# Patient Record
Sex: Male | Born: 1980 | Race: Black or African American | Hispanic: No | Marital: Married | Smoking: Never smoker
Health system: Southern US, Community
[De-identification: ages and names within clinical notes are randomized; demographics above are authoritative.]

## PROBLEM LIST (undated history)

## (undated) DIAGNOSIS — U071 COVID-19: Secondary | ICD-10-CM

## (undated) DIAGNOSIS — T7840XA Allergy, unspecified, initial encounter: Secondary | ICD-10-CM

## (undated) DIAGNOSIS — G4733 Obstructive sleep apnea (adult) (pediatric): Secondary | ICD-10-CM

## (undated) DIAGNOSIS — K219 Gastro-esophageal reflux disease without esophagitis: Secondary | ICD-10-CM

## (undated) HISTORY — DX: Obstructive sleep apnea (adult) (pediatric): G47.33

## (undated) HISTORY — DX: COVID-19: U07.1

## (undated) HISTORY — DX: Gastro-esophageal reflux disease without esophagitis: K21.9

## (undated) HISTORY — DX: Allergy, unspecified, initial encounter: T78.40XA

---
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
## 2013-02-14 HISTORY — PX: VASECTOMY: SHX75

## 2014-11-10 ENCOUNTER — Encounter: Payer: Self-pay | Admitting: Family Medicine

## 2014-11-10 ENCOUNTER — Ambulatory Visit (INDEPENDENT_AMBULATORY_CARE_PROVIDER_SITE_OTHER): Payer: 59 | Admitting: Family Medicine

## 2014-11-10 VITALS — BP 102/63 | HR 70 | Temp 97.6°F | Ht 68.0 in | Wt 207.0 lb

## 2014-11-10 DIAGNOSIS — E669 Obesity, unspecified: Secondary | ICD-10-CM | POA: Diagnosis not present

## 2014-11-10 DIAGNOSIS — Z0000 Encounter for general adult medical examination without abnormal findings: Secondary | ICD-10-CM

## 2014-11-10 NOTE — Patient Instructions (Addendum)
We'll contact you about the labs drawn today Keep up the great job with your healthy eating efforts and weight loss Check out the information at familydoctor.org entitled "What It Takes to Lose Weight" Try to lose between 1-2 pounds per week by taking in fewer calories and burning off more calories until you reach your target BMI You can succeed by limiting portions, limiting foods dense in calories and fat, becoming more active, and drinking 8 glasses of water a day Don't skip meals, especially breakfast, as skipping meals may alter your metabolism Do not use over-the-counter weight loss pills or gimmicks that claim rapid weight loss A healthy BMI (or body mass index) is between 18.5 and 24.9 You can calculate your ideal BMI at the NIH website [employer]  Health Maintenance A healthy lifestyle and preventative care can promote health and wellness.  Maintain regular health, dental, and eye exams.  Eat a healthy diet. Foods like vegetables, fruits, whole grains, low-fat dairy products, and lean protein foods contain the nutrients you need and are low in calories. Decrease your intake of foods high in solid fats, added sugars, and salt. Get information about a proper diet from your health care provider, if necessary.  Regular physical exercise is one of the most important things you can do for your health. Most adults should get at least 150 minutes of moderate-intensity exercise (any activity that increases your heart rate and causes you to sweat) each week. In addition, most adults need muscle-strengthening exercises on 2 or more days a week.   Maintain a healthy weight. The body mass index (BMI) is a screening tool to identify possible weight problems. It provides an estimate of body fat based on height and weight. Your health care provider can find your BMI and can help you achieve or maintain a healthy weight. For males 20 years and  older:  A BMI below 18.5 is considered underweight.  A BMI of 18.5 to 24.9 is normal.  A BMI of 25 to 29.9 is considered overweight.  A BMI of 30 and above is considered obese.  Maintain normal blood lipids and cholesterol by exercising and minimizing your intake of saturated fat. Eat a balanced diet with plenty of fruits and vegetables. Blood tests for lipids and cholesterol should begin at age 36 and be repeated every 5 years. If your lipid or cholesterol levels are high, you are over age 86, or you are at high risk for heart disease, you may need your cholesterol levels checked more frequently.Ongoing high lipid and cholesterol levels should be treated with medicines if diet and exercise are not working.  If you smoke, find out from your health care provider how to quit. If you do not use tobacco, do not start.  Lung cancer screening is recommended for adults aged 55-80 years who are at high risk for developing lung cancer because of a history of smoking. A yearly low-dose CT scan of the lungs is recommended for people who have at least a 30-pack-year history of smoking and are current smokers or have quit within the past 15 years. A pack year of smoking is smoking an average of 1 pack of cigarettes a day for 1 year (for example, a 30-pack-year history of smoking could mean smoking 1 pack a day for 30 years or 2 packs a day for 15 years). Yearly screening should continue until the smoker has stopped smoking for at least 15 years. Yearly screening should be stopped for people who develop a health  problem that would prevent them from having lung cancer treatment.  If you choose to drink alcohol, do not have more than 2 drinks per day. One drink is considered to be 12 oz (360 mL) of beer, 5 oz (150 mL) of wine, or 1.5 oz (45 mL) of liquor.  Avoid the use of street drugs. Do not share needles with anyone. Ask for help if you need support or instructions about stopping the use of drugs.  High  blood pressure causes heart disease and increases the risk of stroke. Blood pressure should be checked at least every 1-2 years. Ongoing high blood pressure should be treated with medicines if weight loss and exercise are not effective.  If you are 28-1 years old, ask your health care provider if you should take aspirin to prevent heart disease.  Diabetes screening involves taking a blood sample to check your fasting blood sugar level. This should be done once every 3 years after age 57 if you are at a normal weight and without risk factors for diabetes. Testing should be considered at a younger age or be carried out more frequently if you are overweight and have at least 1 risk factor for diabetes.  Colorectal cancer can be detected and often prevented. Most routine colorectal cancer screening begins at the age of 56 and continues through age 85. However, your health care provider may recommend screening at an earlier age if you have risk factors for colon cancer. On a yearly basis, your health care provider may provide home test kits to check for hidden blood in the stool. A small camera at the end of a tube may be used to directly examine the colon (sigmoidoscopy or colonoscopy) to detect the earliest forms of colorectal cancer. Talk to your health care provider about this at age 92 when routine screening begins. A direct exam of the colon should be repeated every 5-10 years through age 59, unless early forms of precancerous polyps or small growths are found.  People who are at an increased risk for hepatitis B should be screened for this virus. You are considered at high risk for hepatitis B if:  You were born in a country where hepatitis B occurs often. Talk with your health care provider about which countries are considered high risk.  Your parents were born in a high-risk country and you have not received a shot to protect against hepatitis B (hepatitis B vaccine).  You have HIV or AIDS.  You  use needles to inject street drugs.  You live with, or have sex with, someone who has hepatitis B.  You are a man who has sex with other men (MSM).  You get hemodialysis treatment.  You take certain medicines for conditions like cancer, organ transplantation, and autoimmune conditions.  Hepatitis C blood testing is recommended for all people born from [birth year] through 1965 and any individual with known risk factors for hepatitis C.  Healthy men should no longer receive prostate-specific antigen (PSA) blood tests as part of routine cancer screening. Talk to your health care provider about prostate cancer screening.  Testicular cancer screening is not recommended for adolescents or adult males who have no symptoms. Screening includes self-exam, a health care provider exam, and other screening tests. Consult with your health care provider about any symptoms you have or any concerns you have about testicular cancer.  Practice safe sex. Use condoms and avoid high-risk sexual practices to reduce the spread of sexually transmitted infections (STIs).  You should  be screened for STIs, including gonorrhea and chlamydia if:  You are sexually active and are younger than 24 years.  You are older than 24 years, and your health care provider tells you that you are at risk for this type of infection.  Your sexual activity has changed since you were last screened, and you are at an increased risk for chlamydia or gonorrhea. Ask your health care provider if you are at risk.  If you are at risk of being infected with HIV, it is recommended that you take a prescription medicine daily to prevent HIV infection. This is called pre-exposure prophylaxis (PrEP). You are considered at risk if:  You are a man who has sex with other men (MSM).  You are a heterosexual man who is sexually active with multiple partners.  You take drugs by injection.  You are sexually active with a partner who has HIV.  Talk with  your health care provider about whether you are at high risk of being infected with HIV. If you choose to begin PrEP, you should first be tested for HIV. You should then be tested every 3 months for as long as you are taking PrEP.  Use sunscreen. Apply sunscreen liberally and repeatedly throughout the day. You should seek shade when your shadow is shorter than you. Protect yourself by wearing long sleeves, pants, a wide-brimmed hat, and sunglasses year round whenever you are outdoors.  Tell your health care provider of new moles or changes in moles, especially if there is a change in shape or color. Also, tell your health care provider if a mole is larger than the size of a pencil eraser.  A one-time screening for abdominal aortic aneurysm (AAA) and surgical repair of large AAAs by ultrasound is recommended for men aged 65-75 years who are current or former smokers.  Stay current with your vaccines (immunizations). Document Released: 07/30/2007 Document Revised: 02/05/2013 Document Reviewed: 06/28/2010 [facility] Patient Information 2015 [place], [place]. This information is not intended to replace advice given to you by your health care provider. Make sure you discuss any questions you have with your health care provider.

## 2014-11-10 NOTE — Assessment & Plan Note (Signed)
See AVS; encouraged patient to keep up the good work; down 7 pounds from last year

## 2014-11-10 NOTE — Assessment & Plan Note (Signed)
Healthy living encouraged; reviewed grade A and B USPSTF recommendations; don't text and drive; see hand-out in AVS; colonoscopy at age 34; flu shot UTD this season already; return in one year for next physical

## 2014-11-10 NOTE — Progress Notes (Signed)
Patient ID: Jose Lowe, male   DOB: November 01, 1980, 34 y.o.   MRN: [id number]   Subjective:   Jose Lowe is a 34 y.o. male here for a complete physical exam  Interim issues since last visit: no  USPSTF grade A and B recs reviewed Diet: changed to lean cuisines Obesity; working on weight loss Exercises: sometimes, sometimes works 12 hours, 3rd shift Alcohol: less than 14/week; less than 4/occasion Tobacco; none Depression screen PHQ 2/9 11/10/2014  Decreased Interest 0  Down, Depressed, Hopeless 0  PHQ - 2 Score 0  HIV, Hep B, Hep C: declined Chol: today Glucose: today meatloaf and potatoes and cabbage an hour ago Colon cancer: no one in family; start at age 89 Breast cancer: maternal GM; no one else   History reviewed. No pertinent past medical history. Past Surgical History  Procedure Laterality Date  . Vasectomy  2015  . Ankle surgery Right 1996   Family History  Problem Relation Age of Onset  . Hypertension Mother   . Hypertension Father   . Cancer Father 58    prostate  . Cancer Maternal Uncle     prostate  . Cancer Maternal Grandmother     breast  . Diabetes Maternal Grandmother   . Stroke Maternal Grandmother   . COPD Paternal Grandmother   . COPD Paternal Grandfather   . Heart disease Neg Hx    Social History  Substance Use Topics  . Smoking status: Never Smoker   . Smokeless tobacco: Never Used  . Alcohol Use: No     Comment: occasional   Review of Systems  Constitutional: Negative for unexpected weight change.  HENT: Negative for dental problem and sore throat.   Eyes: Negative for visual disturbance.  Respiratory: Negative for cough and wheezing.   Cardiovascular: Negative for chest pain.  Gastrointestinal: Negative for blood in stool.  Endocrine: Negative for cold intolerance, heat intolerance and polydipsia.  Genitourinary: Negative for hematuria, decreased urine volume and difficulty urinating.  Musculoskeletal: Negative for joint swelling and  arthralgias.  Skin:       No worrisome moles  Allergic/Immunologic: Negative for food allergies.  Neurological: Positive for tremors (just when not eating enough).  Psychiatric/Behavioral: Negative for dysphoric mood.    Objective:   Filed Vitals:   11/10/14 1509  BP: 102/63  Pulse: 70  Temp: 97.6 F (36.4 C)  Height:  (1.727 m)  Weight: 207 lb (93.895 kg)  SpO2: 99%   Body mass index is 31.48 kg/(m^2). Wt Readings from Last 3 Encounters:  11/10/14 207 lb (93.895 kg)  11/08/13 214 lb (97.07 kg)   Physical Exam  Constitutional: He appears well-developed and well-nourished. No distress.  HENT:  Head: Normocephalic and atraumatic.  Eyes: EOM are normal. No scleral icterus.  Neck: No thyromegaly present.  Cardiovascular: Normal rate and regular rhythm.   Pulmonary/Chest: Effort normal and breath sounds normal.  Abdominal: Soft. Bowel sounds are normal. He exhibits no distension.  Musculoskeletal: He exhibits no edema.  Neurological: Coordination normal.  Skin: Skin is warm and dry. No pallor.  Psychiatric: He has a normal mood and affect. His behavior is normal. Judgment and thought content normal.   Assessment/Plan:   Problem List Items Addressed This Visit      Other   Preventative health care - Primary    Healthy living encouraged; reviewed grade A and B USPSTF recommendations; don't text and drive; see hand-out in AVS; colonoscopy at age 58; flu shot UTD this season already; return  in one year for next physical      Relevant Orders   Comprehensive metabolic panel   Lipid Panel w/o Chol/HDL Ratio   Obesity    See AVS; encouraged patient to keep up the good work; down 7 pounds from last year         Follow up plan: Return in about 1 year (around 11/10/2015).  An after-visit summary was printed and given to the patient at check-out.  Please see the patient instructions which may contain other information and recommendations beyond what is mentioned above  in the assessment and plan. Orders Placed This Encounter  Procedures  . Comprehensive metabolic panel  . Lipid Panel w/o Chol/HDL Ratio

## 2014-11-11 LAB — COMPREHENSIVE METABOLIC PANEL
A/G RATIO: 1.6 (ref 1.1–2.5)
ALBUMIN: 4.2 g/dL (ref 3.5–5.5)
ALT: 36 IU/L (ref 0–44)
AST: 30 IU/L (ref 0–40)
Alkaline Phosphatase: 73 IU/L (ref 39–117)
BUN / CREAT RATIO: 8 (ref 8–19)
BUN: 10 mg/dL (ref 6–20)
Bilirubin Total: 0.3 mg/dL (ref 0.0–1.2)
CALCIUM: 9.4 mg/dL (ref 8.7–10.2)
CO2: 25 mmol/L (ref 18–29)
CREATININE: 1.2 mg/dL (ref 0.76–1.27)
Chloride: 102 mmol/L (ref 97–108)
GFR, EST AFRICAN AMERICAN: 89 mL/min/{1.73_m2} (ref >59)
GFR, EST NON AFRICAN AMERICAN: 77 mL/min/{1.73_m2} (ref >59)
GLOBULIN, TOTAL: 2.6 g/dL (ref 1.5–4.5)
Glucose: 83 mg/dL (ref 65–99)
POTASSIUM: 4.6 mmol/L (ref 3.5–5.2)
SODIUM: 140 mmol/L (ref 134–144)
Total Protein: 6.8 g/dL (ref 6.0–8.5)

## 2014-11-11 LAB — LIPID PANEL W/O CHOL/HDL RATIO
Cholesterol, Total: 200 mg/dL — ABNORMAL HIGH (ref 100–199)
HDL: 31 mg/dL — ABNORMAL LOW (ref >39)
LDL CALC: 126 mg/dL — AB (ref 0–99)
Triglycerides: 216 mg/dL — ABNORMAL HIGH (ref 0–149)
VLDL Cholesterol Cal: 43 mg/dL — ABNORMAL HIGH (ref 5–40)

## 2014-11-12 ENCOUNTER — Telehealth: Payer: Self-pay | Admitting: Family Medicine

## 2014-11-12 DIAGNOSIS — E786 Lipoprotein deficiency: Secondary | ICD-10-CM

## 2014-11-12 NOTE — Telephone Encounter (Signed)
i talked with patient about his labs; HDL is low; TG up just a little; he'll try to increase activity and watch diet; no meds needed at this time

## 2015-11-11 ENCOUNTER — Encounter: Payer: 59 | Admitting: Family Medicine

## 2015-12-10 ENCOUNTER — Ambulatory Visit (INDEPENDENT_AMBULATORY_CARE_PROVIDER_SITE_OTHER): Payer: 59 | Admitting: Family Medicine

## 2015-12-10 ENCOUNTER — Encounter: Payer: Self-pay | Admitting: Family Medicine

## 2015-12-10 VITALS — BP 106/64 | HR 71 | Temp 97.6°F | Resp 16 | Ht 67.75 in | Wt 204.5 lb

## 2015-12-10 DIAGNOSIS — Z6830 Body mass index (BMI) 30.0-30.9, adult: Secondary | ICD-10-CM | POA: Diagnosis not present

## 2015-12-10 DIAGNOSIS — E6609 Other obesity due to excess calories: Secondary | ICD-10-CM | POA: Diagnosis not present

## 2015-12-10 DIAGNOSIS — Z23 Encounter for immunization: Secondary | ICD-10-CM

## 2015-12-10 DIAGNOSIS — Z0000 Encounter for general adult medical examination without abnormal findings: Secondary | ICD-10-CM

## 2015-12-10 NOTE — Assessment & Plan Note (Signed)
USPSTF grade A and B recommendations reviewed with patient; age-appropriate recommendations, preventive care, screening tests, etc discussed and encouraged; healthy living encouraged; see AVS for patient education given to patient  

## 2015-12-10 NOTE — Patient Instructions (Addendum)
Start colonoscopy screening at age 37, but call us sooner if any change in your bowel habits or blood in the stool Try to limit saturated fats in your diet (bologna, hot dogs, barbeque, cheeseburgers, hamburgers, steak, bacon, sausage, cheese, etc.) and get more fresh fruits, vegetables, and whole grains  Return for fasting labs any time in the next few weeksHealth Maintenance, Male A healthy lifestyle and preventative care can promote health and wellness.  Maintain regular health, dental, and eye exams.  Eat a healthy diet. Foods like vegetables, fruits, whole grains, low-fat dairy products, and lean protein foods contain the nutrients you need and are low in calories. Decrease your intake of foods high in solid fats, added sugars, and salt. Get information about a proper diet from your health care provider, if necessary.  Regular physical exercise is one of the most important things you can do for your health. Most adults should get at least 150 minutes of moderate-intensity exercise (any activity that increases your heart rate and causes you to sweat) each week. In addition, most adults need muscle-strengthening exercises on 2 or more days a week.   Maintain a healthy weight. The body mass index (BMI) is a screening tool to identify possible weight problems. It provides an estimate of body fat based on height and weight. Your health care provider can find your BMI and can help you achieve or maintain a healthy weight. For males 20 years and older:  A BMI below 18.5 is considered underweight.  A BMI of 18.5 to 24.9 is normal.  A BMI of 25 to 29.9 is considered overweight.  A BMI of 30 and above is considered obese.  Maintain normal blood lipids and cholesterol by exercising and minimizing your intake of saturated fat. Eat a balanced diet with plenty of fruits and vegetables. Blood tests for lipids and cholesterol should begin at age 10720 and be repeated every 5 years. If your lipid or  cholesterol levels are high, you are over age 37, or you are at high risk for heart disease, you may need your cholesterol levels checked more frequently.Ongoing high lipid and cholesterol levels should be treated with medicines if diet and exercise are not working.  If you smoke, find out from your health care provider how to quit. If you do not use tobacco, do not start.  Lung cancer screening is recommended for adults aged 55-80 years who are at high risk for developing lung cancer because of a history of smoking. A yearly low-dose CT scan of the lungs is recommended for people who have at least a 30-pack-year history of smoking and are current smokers or have quit within the past 15 years. A pack year of smoking is smoking an average of 1 pack of cigarettes a day for 1 year (for example, a 30-pack-year history of smoking could mean smoking 1 pack a day for 30 years or 2 packs a day for 15 years). Yearly screening should continue until the smoker has stopped smoking for at least 15 years. Yearly screening should be stopped for people who develop a health problem that would prevent them from having lung cancer treatment.  If you choose to drink alcohol, do not have more than 2 drinks per day. One drink is considered to be 12 oz (360 mL) of beer, 5 oz (150 mL) of wine, or 1.5 oz (45 mL) of liquor.  Avoid the use of street drugs. Do not share needles with anyone. Ask for help if you need  support or instructions about stopping the use of drugs.  High blood pressure causes heart disease and increases the risk of stroke. High blood pressure is more likely to develop in:  People who have blood pressure in the end of the normal range (100-139/85-89 mm Hg).  People who are overweight or obese.  People who are African American.  If you are 27-61 years of age, have your blood pressure checked every 3-5 years. If you are 104 years of age or older, have your blood pressure checked every year. You should have  your blood pressure measured twice--once when you are at a hospital or clinic, and once when you are not at a hospital or clinic. Record the average of the two measurements. To check your blood pressure when you are not at a hospital or clinic, you can use:  An automated blood pressure machine at a pharmacy.  A home blood pressure monitor.  If you are 50-39 years old, ask your health care provider if you should take aspirin to prevent heart disease.  Diabetes screening involves taking a blood sample to check your fasting blood sugar level. This should be done once every 3 years after age 55 if you are at a normal weight and without risk factors for diabetes. Testing should be considered at a younger age or be carried out more frequently if you are overweight and have at least 1 risk factor for diabetes.  Colorectal cancer can be detected and often prevented. Most routine colorectal cancer screening begins at the age of 38 and continues through age 71. However, your health care provider may recommend screening at an earlier age if you have risk factors for colon cancer. On a yearly basis, your health care provider may provide home test kits to check for hidden blood in the stool. A small camera at the end of a tube may be used to directly examine the colon (sigmoidoscopy or colonoscopy) to detect the earliest forms of colorectal cancer. Talk to your health care provider about this at age 81 when routine screening begins. A direct exam of the colon should be repeated every 5-10 years through age 46, unless early forms of precancerous polyps or small growths are found.  People who are at an increased risk for hepatitis B should be screened for this virus. You are considered at high risk for hepatitis B if:  You were born in a country where hepatitis B occurs often. Talk with your health care provider about which countries are considered high risk.  Your parents were born in a high-risk country and you  have not received a shot to protect against hepatitis B (hepatitis B vaccine).  You have HIV or AIDS.  You use needles to inject street drugs.  You live with, or have sex with, someone who has hepatitis B.  You are a man who has sex with other men (MSM).  You get hemodialysis treatment.  You take certain medicines for conditions like cancer, organ transplantation, and autoimmune conditions.  Hepatitis C blood testing is recommended for all people born from [birth year] through 1967 and any individual with known risk factors for hepatitis C.  Healthy men should no longer receive prostate-specific antigen (PSA) blood tests as part of routine cancer screening. Talk to your health care provider about prostate cancer screening.  Testicular cancer screening is not recommended for adolescents or adult males who have no symptoms. Screening includes self-exam, a health care provider exam, and other screening tests. Consult with your  health care provider about any symptoms you have or any concerns you have about testicular cancer.  Practice safe sex. Use condoms and avoid high-risk sexual practices to reduce the spread of sexually transmitted infections (STIs).  You should be screened for STIs, including gonorrhea and chlamydia if:  You are sexually active and are younger than 24 years.  You are older than 24 years, and your health care provider tells you that you are at risk for this type of infection.  Your sexual activity has changed since you were last screened, and you are at an increased risk for chlamydia or gonorrhea. Ask your health care provider if you are at risk.  If you are at risk of being infected with HIV, it is recommended that you take a prescription medicine daily to prevent HIV infection. This is called pre-exposure prophylaxis (PrEP). You are considered at risk if:  You are a man who has sex with other men (MSM).  You are a heterosexual man who is sexually active with multiple  partners.  You take drugs by injection.  You are sexually active with a partner who has HIV.  Talk with your health care provider about whether you are at high risk of being infected with HIV. If you choose to begin PrEP, you should first be tested for HIV. You should then be tested every 3 months for as long as you are taking PrEP.  Use sunscreen. Apply sunscreen liberally and repeatedly throughout the day. You should seek shade when your shadow is shorter than you. Protect yourself by wearing long sleeves, pants, a wide-brimmed hat, and sunglasses year round whenever you are outdoors.  Tell your health care provider of new moles or changes in moles, especially if there is a change in shape or color. Also, tell your health care provider if a mole is larger than the size of a pencil eraser.  A one-time screening for abdominal aortic aneurysm (AAA) and surgical repair of large AAAs by ultrasound is recommended for men aged 10-75 years who are current or former smokers.  Stay current with your vaccines (immunizations).   This information is not intended to replace advice given to you by your health care provider. Make sure you discuss any questions you have with your health care provider.   Document Released: 07/30/2007 Document Revised: 02/21/2014 Document Reviewed: 06/28/2010 Elsevier Interactive Patient Education [employer].

## 2015-12-10 NOTE — Progress Notes (Signed)
BP 106/64 (BP Location: Left Arm, Patient Position: Sitting, Cuff Size: Large)   Pulse 71   Temp 97.6 F (36.4 C) (Oral)   Resp 16   Ht 5' 7.75" (1.721 m)   Wt 204 lb 8 oz (92.8 kg)   SpO2 97%   BMI 31.32 kg/m    Subjective:    Patient ID: Jose Lowe, male    DOB: 1980-07-01, 35 y.o.   MRN: [id number]  HPI: Jose Lowe is a 35 y.o. male  Chief Complaint  Patient presents with  . Annual Exam   Patient is here for his yearly physical; no complaints  USPSTF grade A and B recommendations Alcohol: less than 14 drinks / week Depression:  Depression screen [facility] 2/9 12/10/2015 11/10/2014  Decreased Interest 0 0  Down, Depressed, Hopeless 0 0  PHQ - 2 Score 0 0  Hypertension: excellent Obesity: losing weight over last 2 years Tobacco use: nonsmoker; never smoker HIV, hep B, hep C: declined STD testing and prevention (chl/gon/syphilis): okay Lipids: ate before he came in, will return another day Glucose: will return another day Colorectal cancer: uncle had colon cancer, in remission now; father had colon cancer around 65 Breast cancer: no lumps Lung cancer: n/a Osteoporosis: n/a AAA: n/a Aspirin: n/a Diet: typical [employer] eater Exercise: walking at work Skin cancer: no worrisome moles Works 3rd shift  Depression screen [facility] 2/9 12/10/2015 11/10/2014  Decreased Interest 0 0  Down, Depressed, Hopeless 0 0  PHQ - 2 Score 0 0   Relevant past medical, surgical, family and social history reviewed Past Medical History:  Diagnosis Date  . Allergy    Seasonal   Past Surgical History:  Procedure Laterality Date  . ANKLE SURGERY Right 1996  . VASECTOMY  2015   Family History  Problem Relation Age of Onset  . Hypertension Mother   . Hyperlipidemia Mother   . Hypertension Father   . Cancer Father 43    prostate  . Cancer Maternal Uncle     prostate  . Cancer Maternal Grandmother     breast  . Diabetes Maternal Grandmother   . Stroke Maternal Grandmother   . COPD  Paternal Grandmother   . Cancer Paternal Grandmother     Lung Cancer  . COPD Paternal Grandfather   . Cancer Paternal Grandfather     Lung Cancer  . Schizophrenia Brother   . Asthma Daughter     Mild  . Heart disease Neg Hx    Social History  Substance Use Topics  . Smoking status: Never Smoker  . Smokeless tobacco: Never Used  . Alcohol use Yes     Comment: occasionally   Interim medical history since last visit reviewed. Allergies and medications reviewed  Review of Systems  Constitutional: Negative for unexpected weight change.  HENT: Negative for hearing loss.   Eyes: Positive for visual disturbance (wears glasses; sees eye doctor regularly).  Respiratory: Negative for wheezing.   Cardiovascular: Negative for chest pain.  Gastrointestinal: Negative for blood in stool.  Endocrine: Negative for polydipsia and polyuria.  Genitourinary: Negative for decreased urine volume and hematuria.  Musculoskeletal: Positive for arthralgias (just right ankle where he had surgery, maybe when cold).  Skin:       No worrisome moles  Allergic/Immunologic: Negative for food allergies.  Neurological: Negative for tremors.  Hematological: Negative for adenopathy. Does not bruise/bleed easily.  Psychiatric/Behavioral: Negative for dysphoric mood.  Per HPI unless specifically indicated above     Objective:  BP 106/64 (BP Location: Left Arm, Patient Position: Sitting, Cuff Size: Large)   Pulse 71   Temp 97.6 F (36.4 C) (Oral)   Resp 16   Ht 5' 7.75" (1.721 m)   Wt 204 lb 8 oz (92.8 kg)   SpO2 97%   BMI 31.32 kg/m   Wt Readings from Last 3 Encounters:  12/10/15 204 lb 8 oz (92.8 kg)  11/10/14 207 lb (93.9 kg)  11/08/13 214 lb (97.1 kg)    Physical Exam  Constitutional: He appears well-developed and well-nourished. No distress.  HENT:  Head: Normocephalic and atraumatic.  Nose: Nose normal.  Mouth/Throat: Oropharynx is clear and moist.  Eyes: EOM are normal. No scleral  icterus.  Neck: No JVD present. No thyromegaly present.  Cardiovascular: Normal rate, regular rhythm and normal heart sounds.   Pulmonary/Chest: Effort normal and breath sounds normal. No respiratory distress. He has no wheezes. He has no rales.  Abdominal: Soft. Bowel sounds are normal. He exhibits no distension. There is no tenderness. There is no guarding.  Musculoskeletal: Normal range of motion. He exhibits no edema.  Surgical scar lateral RIGHT ankle  Lymphadenopathy:    He has no cervical adenopathy.  Neurological: He is alert. He displays normal reflexes. He exhibits normal muscle tone. Coordination normal.  Skin: Skin is warm and dry. No rash noted. He is not diaphoretic. No erythema. No pallor.  Psychiatric: He has a normal mood and affect. His behavior is normal. Judgment and thought content normal. His mood appears not anxious. He does not exhibit a depressed mood.   Results for orders placed or performed in visit on 11/10/14  Comprehensive metabolic panel  Result Value Ref Range   Glucose 83 65 - 99 mg/dL   BUN 10 6 - 20 mg/dL   Creatinine, Ser 1.61 0.76 - 1.27 mg/dL   GFR calc non Af Amer 77 >59 mL/min/1.73   GFR calc Af Amer 89 >59 mL/min/1.73   BUN/Creatinine Ratio 8 8 - 19   Sodium 140 134 - 144 mmol/L   Potassium 4.6 3.5 - 5.2 mmol/L   Chloride 102 97 - 108 mmol/L   CO2 25 18 - 29 mmol/L   Calcium 9.4 8.7 - 10.2 mg/dL   Total Protein 6.8 6.0 - 8.5 g/dL   Albumin 4.2 3.5 - 5.5 g/dL   Globulin, Total 2.6 1.5 - 4.5 g/dL   Albumin/Globulin Ratio 1.6 1.1 - 2.5   Bilirubin Total 0.3 0.0 - 1.2 mg/dL   Alkaline Phosphatase 73 39 - 117 IU/L   AST 30 0 - 40 IU/L   ALT 36 0 - 44 IU/L  Lipid Panel w/o Chol/HDL Ratio  Result Value Ref Range   Cholesterol, Total 200 (H) 100 - 199 mg/dL   Triglycerides 096 (H) 0 - 149 mg/dL   HDL 31 (L) >04 mg/dL   VLDL Cholesterol Cal 43 (H) 5 - 40 mg/dL   LDL Calculated 540 (H) 0 - 99 mg/dL      Assessment & Plan:   Problem List  Items Addressed This Visit      Other   Preventative health care - Primary    USPSTF grade A and B recommendations reviewed with patient; age-appropriate recommendations, preventive care, screening tests, etc discussed and encouraged; healthy living encouraged; see AVS for patient education given to patient      Relevant Orders   CBC with Differential/Platelet   Lipid panel   COMPLETE METABOLIC PANEL WITH GFR   Obesity  Working on weight loss, pleased to see results; aim for another 5-10 pounds over the coming year       Other Visit Diagnoses    Needs flu shot       Relevant Orders   Flu Vaccine QUAD 36+ mos PF IM (Fluarix & Fluzone Quad PF) (Completed)      Follow up plan: Return in about 1 year (around 12/09/2016) for complete physical.  An after-visit summary was printed and given to the patient at check-out.  Please see the patient instructions which may contain other information and recommendations beyond what is mentioned above in the assessment and plan.  No orders of the defined types were placed in this encounter.   Orders Placed This Encounter  Procedures  . Flu Vaccine QUAD 36+ mos PF IM (Fluarix & Fluzone Quad PF)  . CBC with Differential/Platelet  . Lipid panel  . COMPLETE METABOLIC PANEL WITH GFR

## 2015-12-10 NOTE — Assessment & Plan Note (Signed)
Working on weight loss, pleased to see results; aim for another 5-10 pounds over the coming year

## 2016-01-16 ENCOUNTER — Telehealth: Payer: Self-pay | Admitting: Family Medicine

## 2016-01-16 NOTE — Telephone Encounter (Signed)
Please ask patient if he could please get the labs done sometime soon in the next week or two These were ordered at his physical; thank you

## 2016-01-19 NOTE — Telephone Encounter (Signed)
Called patient and left him a detailed messaged about getting his labs done. Mention to patient he can come in between the hours 8-12 and 2 to 4 mon-fri.

## 2016-11-22 DIAGNOSIS — Z23 Encounter for immunization: Secondary | ICD-10-CM | POA: Diagnosis not present

## 2016-12-19 ENCOUNTER — Ambulatory Visit (INDEPENDENT_AMBULATORY_CARE_PROVIDER_SITE_OTHER): Payer: 59 | Admitting: Family Medicine

## 2016-12-19 ENCOUNTER — Encounter: Payer: Self-pay | Admitting: Family Medicine

## 2016-12-19 ENCOUNTER — Other Ambulatory Visit: Payer: Self-pay | Admitting: Family Medicine

## 2016-12-19 VITALS — BP 116/78 | HR 79 | Temp 98.5°F | Resp 14 | Ht 68.25 in | Wt 215.5 lb

## 2016-12-19 DIAGNOSIS — Z0000 Encounter for general adult medical examination without abnormal findings: Secondary | ICD-10-CM | POA: Diagnosis not present

## 2016-12-19 DIAGNOSIS — E6609 Other obesity due to excess calories: Secondary | ICD-10-CM

## 2016-12-19 DIAGNOSIS — Z114 Encounter for screening for human immunodeficiency virus [HIV]: Secondary | ICD-10-CM | POA: Diagnosis not present

## 2016-12-19 DIAGNOSIS — Z6832 Body mass index (BMI) 32.0-32.9, adult: Secondary | ICD-10-CM

## 2016-12-19 DIAGNOSIS — Z113 Encounter for screening for infections with a predominantly sexual mode of transmission: Secondary | ICD-10-CM

## 2016-12-19 NOTE — Assessment & Plan Note (Signed)
USPSTF grade A and B recommendations reviewed with patient; age-appropriate recommendations, preventive care, screening tests, etc discussed and encouraged; healthy living encouraged; see AVS for patient education given to patient  

## 2016-12-19 NOTE — Addendum Note (Signed)
Addended by: Frankey ShownGLANTON, Marsha Gundlach C on: 12/19/2016 11:47 AM   Modules accepted: Orders

## 2016-12-19 NOTE — Assessment & Plan Note (Signed)
Encouraged patient to lose modest weight

## 2016-12-19 NOTE — Progress Notes (Signed)
Patient ID: Jose Lowe, male   DOB: 11/04/1980, 36 y.o.   MRN: [id number]   Subjective:   Jose Witham is a 36 y.o. male here for a complete physical exam  Interim issues since last visit: no medical excitement  USPSTF grade A and B recommendations Depression:  Depression screen [facility] 2/9 12/19/2016 12/10/2015 11/10/2014  Decreased Interest 0 0 0  Down, Depressed, Hopeless 0 0 0  PHQ - 2 Score 0 0 0   Hypertension: BP Readings from Last 3 Encounters:  12/19/16 116/78  12/10/15 106/64  11/10/14 102/63   Obesity: Wt Readings from Last 3 Encounters:  12/19/16 215 lb 8 oz (97.8 kg)  12/10/15 204 lb 8 oz (92.8 kg)  11/10/14 207 lb (93.9 kg)   BMI Readings from Last 3 Encounters:  12/19/16 32.53 kg/m  12/10/15 31.32 kg/m  11/10/14 31.47 kg/m    Skin cancer: nothing worrisome Lung cancer:  nonsmoker Prostate cancer: father had prostate cancer, caught it in time, no treatments needed; no sx No results found for: PSA Colorectal cancer: no fam hx; start screening at age 66  AAA: n/a Aspirin: n/a Diet: it depends; thows a salad in, sometimes grilled chicken; getting fruits and veggies Exercise: doing a little walking at work Alcohol: no more than 2-4 drinks a week Tobacco use: nonsmoker HIV, hep B, hep C: okay for testing STD testing and prevention (chl/gon/syphilis): okay for testing  Lipids: fasting Lab Results  Component Value Date   CHOL 200 (H) 11/10/2014   Lab Results  Component Value Date   HDL 31 (L) 11/10/2014   Lab Results  Component Value Date   LDLCALC 126 (H) 11/10/2014   Lab Results  Component Value Date   TRIG 216 (H) 11/10/2014   No results found for: CHOLHDL No results found for: LDLDIRECT Glucose:  Fasting today Glucose  Date Value Ref Range Status  11/10/2014 83 65 - 99 mg/dL Final    Past Medical History:  Diagnosis Date  . Allergy    Seasonal   Past Surgical History:  Procedure Laterality Date  . ANKLE SURGERY Right 1996  .  VASECTOMY  2015   Family History  Problem Relation Age of Onset  . Hypertension Mother   . Hyperlipidemia Mother   . Hypertension Father   . Cancer Father 35       prostate  . Cancer Maternal Uncle        prostate  . Cancer Maternal Grandmother        breast  . Diabetes Maternal Grandmother   . Stroke Maternal Grandmother   . COPD Paternal Grandmother   . Cancer Paternal Grandmother        Lung Cancer  . COPD Paternal Grandfather   . Cancer Paternal Grandfather        Lung Cancer  . Schizophrenia Brother   . Asthma Daughter        Mild  . Heart disease Neg Hx    Social History   Tobacco Use  . Smoking status: Never Smoker  . Smokeless tobacco: Never Used  Substance Use Topics  . Alcohol use: Yes    Comment: occasionally   Review of Systems  Objective:   Vitals:   12/19/16 0831  BP: 116/78  Pulse: 79  Resp: 14  Temp: 98.5 F (36.9 C)  TempSrc: Oral  SpO2: 97%  Weight: 215 lb 8 oz (97.8 kg)  Height: 5' 8.25" (1.734 m)   Body mass index is 32.53 kg/m. Wt Readings from  Last 3 Encounters:  12/19/16 215 lb 8 oz (97.8 kg)  12/10/15 204 lb 8 oz (92.8 kg)  11/10/14 207 lb (93.9 kg)   Physical Exam  Constitutional: He appears well-developed and well-nourished. No distress.  HENT:  Head: Normocephalic and atraumatic.  Nose: Nose normal.  Mouth/Throat: Oropharynx is clear and moist.  Eyes: EOM are normal. No scleral icterus.  Neck: No JVD present. No thyromegaly present.  Cardiovascular: Normal rate, regular rhythm and normal heart sounds.  Pulmonary/Chest: Effort normal and breath sounds normal. No respiratory distress. He has no wheezes. He has no rales.  Abdominal: Soft. Bowel sounds are normal. He exhibits no distension. There is no tenderness. There is no guarding.  Musculoskeletal: Normal range of motion. He exhibits no edema.  Lymphadenopathy:    He has no cervical adenopathy.  Neurological: He is alert. He displays normal reflexes. He exhibits  normal muscle tone. Coordination normal.  Skin: Skin is warm and dry. No rash noted. He is not diaphoretic. No erythema. No pallor.  Psychiatric: He has a normal mood and affect. His behavior is normal. Judgment and thought content normal.    Assessment/Plan:   Problem List Items Addressed This Visit      Other   Preventative health care - Primary    USPSTF grade A and B recommendations reviewed with patient; age-appropriate recommendations, preventive care, screening tests, etc discussed and encouraged; healthy living encouraged; see AVS for patient education given to patient      Relevant Orders   CBC with Differential/Platelet   COMPLETE METABOLIC PANEL WITH GFR   Lipid panel   TSH   Obesity    Encouraged patient to lose modest weight       Other Visit Diagnoses    Screening for HIV (human immunodeficiency virus)       Relevant Orders   HIV antibody (with reflex)   Screening examination for STD (sexually transmitted disease)       Relevant Orders   GC/Chlamydia Probe Amp   Hepatitis panel, acute   RPR      No orders of the defined types were placed in this encounter.  Orders Placed This Encounter  Procedures  . GC/Chlamydia Probe Amp  . HIV antibody (with reflex)  . CBC with Differential/Platelet  . COMPLETE METABOLIC PANEL WITH GFR  . Lipid panel  . TSH  . Hepatitis panel, acute  . RPR    Follow up plan: Return in about 1 year (around 12/19/2017) for complete physical.  An After Visit Summary was printed and given to the patient.

## 2016-12-19 NOTE — Patient Instructions (Addendum)
Check out the information at familydoctor.org entitled "Nutrition for Weight Loss: What You Need to Know about Fad Diets" Try to lose between 1-2 pounds per week by taking in fewer calories and burning off more calories You can succeed by limiting portions, limiting foods dense in calories and fat, becoming more active, and drinking 8 glasses of water a day (64 ounces) Don't skip meals, especially breakfast, as skipping meals may alter your metabolism Do not use over-the-counter weight loss pills or gimmicks that claim rapid weight loss A healthy BMI (or body mass index) is between 18.5 and 24.9 You can calculate your ideal BMI at the NIH website [employer]huhttp://www.nhlbi.nih.gov/health/educational/lose_wt/BMI/bmicalc.htm Let's get labs today If you have not heard anything from my staff in a week about any orders/referrals/studies from today, please contact us here to follow-up [phone]) [phone]  Health Maintenance, Male A healthy lifestyle and preventive care is important for your health and wellness. Ask your health care provider about what schedule of regular examinations is right for you. What should I know about weight and diet? Eat a Healthy Diet  Eat plenty of vegetables, fruits, whole grains, low-fat dairy products, and lean protein.  Do not eat a lot of foods high in solid fats, added sugars, or salt.  Maintain a Healthy Weight Regular exercise can help you achieve or maintain a healthy weight. You should:  Do at least 150 minutes of exercise each week. The exercise should increase your heart rate and make you sweat (moderate-intensity exercise).  Do strength-training exercises at least twice a week.  Watch Your Levels of Cholesterol and Blood Lipids  Have your blood tested for lipids and cholesterol every 5 years starting at 38 years of age. If you are at high risk for heart disease, you should start having your blood tested when you are 38 years old. You may need to have your cholesterol  levels checked more often if: ? Your lipid or cholesterol levels are high. ? You are older than 38 years of age. ? You are at high risk for heart disease.  What should I know about cancer screening? Many types of cancers can be detected early and may often be prevented. Lung Cancer  You should be screened every year for lung cancer if: ? You are a current smoker who has smoked for at least 30 years. ? You are a former smoker who has quit within the past 15 years.  Talk to your health care provider about your screening options, when you should start screening, and how often you should be screened.  Colorectal Cancer  Routine colorectal cancer screening usually begins at 38 years of age and should be repeated every 5-10 years until you are 38 years old. You may need to be screened more often if early forms of precancerous polyps or small growths are found. Your health care provider may recommend screening at an earlier age if you have risk factors for colon cancer.  Your health care provider may recommend using home test kits to check for hidden blood in the stool.  A small camera at the end of a tube can be used to examine your colon (sigmoidoscopy or colonoscopy). This checks for the earliest forms of colorectal cancer.  Prostate and Testicular Cancer  Depending on your age and overall health, your health care provider may do certain tests to screen for prostate and testicular cancer.  Talk to your health care provider about any symptoms or concerns you have about testicular or prostate cancer.  Skin Cancer  Check your skin from head to toe regularly.  Tell your health care provider about any new moles or changes in moles, especially if: ? There is a change in a mole's size, shape, or color. ? You have a mole that is larger than a pencil eraser.  Always use sunscreen. Apply sunscreen liberally and repeat throughout the day.  Protect yourself by wearing long sleeves, pants, a  wide-brimmed hat, and sunglasses when outside.  What should I know about heart disease, diabetes, and high blood pressure?  If you are 9518-38 years of age, have your blood pressure checked every 3-5 years. If you are 38 years of age or older, have your blood pressure checked every year. You should have your blood pressure measured twice-once when you are at a hospital or clinic, and once when you are not at a hospital or clinic. Record the average of the two measurements. To check your blood pressure when you are not at a hospital or clinic, you can use: ? An automated blood pressure machine at a pharmacy. ? A home blood pressure monitor.  Talk to your health care provider about your target blood pressure.  If you are between 6245-380 years old, ask your health care provider if you should take aspirin to prevent heart disease.  Have regular diabetes screenings by checking your fasting blood sugar level. ? If you are at a normal weight and have a low risk for diabetes, have this test once every three years after the age of 38. ? If you are overweight and have a high risk for diabetes, consider being tested at a younger age or more often.  A one-time screening for abdominal aortic aneurysm (AAA) by ultrasound is recommended for men aged 65-75 years who are current or former smokers. What should I know about preventing infection? Hepatitis B If you have a higher risk for hepatitis B, you should be screened for this virus. Talk with your health care provider to find out if you are at risk for hepatitis B infection. Hepatitis C Blood testing is recommended for:  Everyone born from [id number] through 1967.  Anyone with known risk factors for hepatitis C.  Sexually Transmitted Diseases (STDs)  You should be screened each year for STDs including gonorrhea and chlamydia if: ? You are sexually active and are younger than 38 years of age. ? You are older than 38 years of age and your health care provider  tells you that you are at risk for this type of infection. ? Your sexual activity has changed since you were last screened and you are at an increased risk for chlamydia or gonorrhea. Ask your health care provider if you are at risk.  Talk with your health care provider about whether you are at high risk of being infected with HIV. Your health care provider may recommend a prescription medicine to help prevent HIV infection.  What else can I do?  Schedule regular health, dental, and eye exams.  Stay current with your vaccines (immunizations).  Do not use any tobacco products, such as cigarettes, chewing tobacco, and e-cigarettes. If you need help quitting, ask your health care provider.  Limit alcohol intake to no more than 2 drinks per day. One drink equals 12 ounces of beer, 5 ounces of wine, or 1 ounces of hard liquor.  Do not use street drugs.  Do not share needles.  Ask your health care provider for help if you need support or information about quitting  drugs.  Tell your health care provider if you often feel depressed.  Tell your health care provider if you have ever been abused or do not feel safe at home. This information is not intended to replace advice given to you by your health care provider. Make sure you discuss any questions you have with your health care provider. Document Released: 07/30/2007 Document Revised: 09/30/2015 Document Reviewed: 11/04/2014 Elsevier Interactive Patient Education  [employer].

## 2016-12-19 NOTE — Addendum Note (Signed)
Addended by: Frankey ShownGLANTON, Exzavier Ruderman C on: 12/19/2016 09:44 AM   Modules accepted: Orders

## 2016-12-20 LAB — HEPATITIS PANEL, ACUTE
HEP A IGM: NONREACTIVE
HEP B C IGM: NONREACTIVE
HEP C AB: NONREACTIVE
Hepatitis B Surface Ag: NONREACTIVE
SIGNAL TO CUT-OFF: 0.03 (ref <1.00)

## 2016-12-20 LAB — CBC WITH DIFFERENTIAL/PLATELET
Basophils Absolute: 43 cells/uL (ref 0–200)
Basophils Relative: 0.6 %
EOS ABS: 107 {cells}/uL (ref 15–500)
Eosinophils Relative: 1.5 %
HCT: 44.9 % (ref 38.5–50.0)
HEMOGLOBIN: 16 g/dL (ref 13.2–17.1)
Lymphs Abs: 2577 cells/uL (ref 850–3900)
MCH: 30.9 pg (ref 27.0–33.0)
MCHC: 35.6 g/dL (ref 32.0–36.0)
MCV: 86.8 fL (ref 80.0–100.0)
MONOS PCT: 8.9 %
MPV: 12.9 fL — ABNORMAL HIGH (ref 7.5–12.5)
NEUTROS ABS: 3742 {cells}/uL (ref 1500–7800)
Neutrophils Relative %: 52.7 %
Platelets: 223 10*3/uL (ref 140–400)
RBC: 5.17 10*6/uL (ref 4.20–5.80)
RDW: 12.9 % (ref 11.0–15.0)
TOTAL LYMPHOCYTE: 36.3 %
WBC mixed population: 632 cells/uL (ref 200–950)
WBC: 7.1 10*3/uL (ref 3.8–10.8)

## 2016-12-20 LAB — COMPLETE METABOLIC PANEL WITH GFR
AG Ratio: 1.4 (calc) (ref 1.0–2.5)
ALBUMIN MSPROF: 4.4 g/dL (ref 3.6–5.1)
ALKALINE PHOSPHATASE (APISO): 74 U/L (ref 40–115)
ALT: 33 U/L (ref 9–46)
AST: 25 U/L (ref 10–40)
BUN: 11 mg/dL (ref 7–25)
CHLORIDE: 103 mmol/L (ref 98–110)
CO2: 29 mmol/L (ref 20–32)
CREATININE: 1.25 mg/dL (ref 0.60–1.35)
Calcium: 9.7 mg/dL (ref 8.6–10.3)
GFR, EST AFRICAN AMERICAN: 84 mL/min/{1.73_m2} (ref >=60)
GFR, Est Non African American: 73 mL/min/{1.73_m2} (ref >=60)
GLOBULIN: 3.1 g/dL (ref 1.9–3.7)
Glucose, Bld: 91 mg/dL (ref 65–99)
POTASSIUM: 4.1 mmol/L (ref 3.5–5.3)
SODIUM: 138 mmol/L (ref 135–146)
TOTAL PROTEIN: 7.5 g/dL (ref 6.1–8.1)
Total Bilirubin: 0.5 mg/dL (ref 0.2–1.2)

## 2016-12-20 LAB — LIPID PANEL
CHOLESTEROL: 212 mg/dL — AB (ref <200)
HDL: 39 mg/dL — ABNORMAL LOW (ref >40)
LDL CHOLESTEROL (CALC): 145 mg/dL — AB
Non-HDL Cholesterol (Calc): 173 mg/dL (calc) — ABNORMAL HIGH (ref <130)
Total CHOL/HDL Ratio: 5.4 (calc) — ABNORMAL HIGH (ref <5.0)
Triglycerides: 153 mg/dL — ABNORMAL HIGH (ref <150)

## 2016-12-20 LAB — C. TRACHOMATIS/N. GONORRHOEAE RNA
C. trachomatis RNA, TMA: NOT DETECTED
N. GONORRHOEAE RNA, TMA: NOT DETECTED

## 2016-12-20 LAB — RPR: RPR: NONREACTIVE

## 2016-12-20 LAB — TSH: TSH: 2.32 mIU/L (ref 0.40–4.50)

## 2016-12-20 LAB — HIV ANTIBODY (ROUTINE TESTING W REFLEX): HIV 1&2 Ab, 4th Generation: NONREACTIVE

## 2017-10-02 ENCOUNTER — Other Ambulatory Visit: Payer: Self-pay

## 2017-10-02 ENCOUNTER — Emergency Department
Admission: EM | Admit: 2017-10-02 | Discharge: 2017-10-02 | Disposition: A | Discharge status: Home / Self Care | Payer: 59 | Attending: Emergency Medicine | Admitting: Emergency Medicine

## 2017-10-02 ENCOUNTER — Encounter: Payer: Self-pay | Admitting: Emergency Medicine

## 2017-10-02 ENCOUNTER — Emergency Department: Payer: 59

## 2017-10-02 DIAGNOSIS — Y998 Other external cause status: Secondary | ICD-10-CM | POA: Insufficient documentation

## 2017-10-02 DIAGNOSIS — Y92410 Unspecified street and highway as the place of occurrence of the external cause: Secondary | ICD-10-CM | POA: Insufficient documentation

## 2017-10-02 DIAGNOSIS — Z91040 Latex allergy status: Secondary | ICD-10-CM | POA: Diagnosis not present

## 2017-10-02 DIAGNOSIS — Y939 Activity, unspecified: Secondary | ICD-10-CM | POA: Diagnosis not present

## 2017-10-02 DIAGNOSIS — S8992XA Unspecified injury of left lower leg, initial encounter: Secondary | ICD-10-CM | POA: Diagnosis not present

## 2017-10-02 DIAGNOSIS — S8002XA Contusion of left knee, initial encounter: Secondary | ICD-10-CM

## 2017-10-02 DIAGNOSIS — M25562 Pain in left knee: Secondary | ICD-10-CM | POA: Diagnosis not present
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
## 2017-10-02 NOTE — ED Notes (Signed)
Pt verbalized understanding of d/c instructions and follow up 

## 2017-10-02 NOTE — Discharge Instructions (Addendum)
Ice and elevate knee as needed for pain or swelling.  Take Tylenol or ibuprofen as needed for soreness and stiffness.  Use ice or heat to your muscles as needed for stiffness and soreness.  Follow-up with your primary care provider if any continued problems.

## 2017-12-25 ENCOUNTER — Encounter: Payer: 59 | Admitting: Family Medicine

## 2018-02-28 ENCOUNTER — Encounter: Payer: Self-pay | Admitting: Family Medicine

## 2018-02-28 ENCOUNTER — Ambulatory Visit (INDEPENDENT_AMBULATORY_CARE_PROVIDER_SITE_OTHER): Payer: 59 | Admitting: Family Medicine

## 2018-02-28 VITALS — BP 124/82 | HR 71 | Temp 97.6°F | Ht 68.0 in | Wt 233.8 lb

## 2018-02-28 DIAGNOSIS — E6609 Other obesity due to excess calories: Secondary | ICD-10-CM | POA: Diagnosis not present

## 2018-02-28 DIAGNOSIS — K219 Gastro-esophageal reflux disease without esophagitis: Secondary | ICD-10-CM | POA: Diagnosis not present

## 2018-02-28 DIAGNOSIS — Z23 Encounter for immunization: Secondary | ICD-10-CM | POA: Diagnosis not present

## 2018-02-28 DIAGNOSIS — Z0000 Encounter for general adult medical examination without abnormal findings: Secondary | ICD-10-CM | POA: Diagnosis not present

## 2018-02-28 DIAGNOSIS — Z6835 Body mass index (BMI) 35.0-35.9, adult: Secondary | ICD-10-CM

## 2018-02-28 LAB — LIPID PANEL
Cholesterol: 215 mg/dL — ABNORMAL HIGH (ref <200)
HDL: 37 mg/dL — AB (ref >40)
LDL Cholesterol (Calc): 154 mg/dL (calc) — ABNORMAL HIGH
NON-HDL CHOLESTEROL (CALC): 178 mg/dL — AB (ref <130)
Total CHOL/HDL Ratio: 5.8 (calc) — ABNORMAL HIGH (ref <5.0)
Triglycerides: 121 mg/dL (ref <150)

## 2018-02-28 LAB — COMPLETE METABOLIC PANEL WITH GFR
AG RATIO: 1.5 (calc) (ref 1.0–2.5)
ALKALINE PHOSPHATASE (APISO): 68 U/L (ref 40–115)
ALT: 27 U/L (ref 9–46)
AST: 26 U/L (ref 10–40)
Albumin: 4.5 g/dL (ref 3.6–5.1)
BILIRUBIN TOTAL: 0.6 mg/dL (ref 0.2–1.2)
BUN: 12 mg/dL (ref 7–25)
CHLORIDE: 101 mmol/L (ref 98–110)
CO2: 28 mmol/L (ref 20–32)
Calcium: 9.8 mg/dL (ref 8.6–10.3)
Creat: 1.21 mg/dL (ref 0.60–1.35)
GFR, EST AFRICAN AMERICAN: 87 mL/min/{1.73_m2} (ref >=60)
GFR, Est Non African American: 75 mL/min/{1.73_m2} (ref >=60)
Globulin: 3 g/dL (calc) (ref 1.9–3.7)
Glucose, Bld: 82 mg/dL (ref 65–99)
POTASSIUM: 4.3 mmol/L (ref 3.5–5.3)
Sodium: 138 mmol/L (ref 135–146)
TOTAL PROTEIN: 7.5 g/dL (ref 6.1–8.1)

## 2018-02-28 LAB — TSH: TSH: 4.88 mIU/L — ABNORMAL HIGH (ref 0.40–4.50)

## 2018-02-28 LAB — CBC
HCT: 42.6 % (ref 38.5–50.0)
Hemoglobin: 14.7 g/dL (ref 13.2–17.1)
MCH: 30.3 pg (ref 27.0–33.0)
MCHC: 34.5 g/dL (ref 32.0–36.0)
MCV: 87.8 fL (ref 80.0–100.0)
MPV: 13 fL — ABNORMAL HIGH (ref 7.5–12.5)
Platelets: 229 10*3/uL (ref 140–400)
RBC: 4.85 10*6/uL (ref 4.20–5.80)
RDW: 13 % (ref 11.0–15.0)
WBC: 7.9 10*3/uL (ref 3.8–10.8)

## 2018-02-28 MED ORDER — FAMOTIDINE 20 MG PO TABS: 20.0000 mg | ORAL_TABLET | Freq: Two times a day (BID) | ORAL | 2 refills | Status: DC | PRN

## 2018-02-28 NOTE — Assessment & Plan Note (Signed)
Avoid eating for 3 hours before bedtime, elevate HOB; start H2 blocker; let me know if not getting better

## 2018-02-28 NOTE — Patient Instructions (Addendum)
Caution: prolonged use of proton pump inhibitors like omeprazole (Prilosec), pantoprazole (Protonix), esomeprazole (Nexium), and others like Dexilant and Aciphex may increase your risk of pneumonia, Clostridium difficile colitis, osteoporosis, anemia and other health complications Try to limit or avoid triggers like coffee, caffeinated beverages, onions, chocolate, spicy foods, peppermint, acidic foods like pizza, spaghetti sauce, and orange juice Lose weight if you are overweight or obese Try elevating the head of your bed by placing a small wedge between your mattress and box springs to keep acid in the stomach at night instead of coming up into your esophagus   Obesity, Adult Obesity is the condition of having too much total body fat. Being overweight or obese means that your weight is greater than what is considered healthy for your body size. Obesity is determined by a measurement called BMI. BMI is an estimate of body fat and is calculated from height and weight. For adults, a BMI of 30 or higher is considered obese. Obesity can eventually lead to other health concerns and major illnesses, including:  Stroke.  Coronary artery disease (CAD).  Type 2 diabetes.  Some types of cancer, including cancers of the colon, breast, uterus, and gallbladder.  Osteoarthritis.  High blood pressure (hypertension).  High cholesterol.  Sleep apnea.  Gallbladder stones.  Infertility problems. What are the causes? The main cause of obesity is taking in (consuming) more calories than your body uses for energy. Other factors that contribute to this condition may include:  Being born with genes that make you more likely to become obese.  Having a medical condition that causes obesity. These conditions include: ? Hypothyroidism. ? Polycystic ovarian syndrome (PCOS). ? Binge-eating disorder. ? Cushing syndrome.  Taking certain medicines, such as steroids, antidepressants, and seizure  medicines.  Not being physically active (sedentary lifestyle).  Living where there are limited places to exercise safely or buy healthy foods.  Not getting enough sleep. What increases the risk? The following factors may increase your risk of this condition:  Having a family history of obesity.  Being a woman of African-American descent.  Being a man of Hispanic descent. What are the signs or symptoms? Having excessive body fat is the main symptom of this condition. How is this diagnosed? This condition may be diagnosed based on:  Your symptoms.  Your medical history.  A physical exam. Your health care provider may measure: ? Your BMI. If you are an adult with a BMI between 25 and less than 30, you are considered overweight. If you are an adult with a BMI of 30 or higher, you are considered obese. ? The distances around your hips and your waist (circumferences). These may be compared to each other to help diagnose your condition. ? Your skinfold thickness. Your health care provider may gently pinch a fold of your skin and measure it. How is this treated? Treatment for this condition often includes changing your lifestyle. Treatment may include some or all of the following:  Dietary changes. Work with your health care provider and a dietitian to set a weight-loss goal that is healthy and reasonable for you. Dietary changes may include eating: ? Smaller portions. A portion size is the amount of a particular food that is healthy for you to eat at one time. This varies from person to person. ? Low-calorie or low-fat options. ? More whole grains, fruits, and vegetables.  Regular physical activity. This may include aerobic activity (cardio) and strength training.  Medicine to help you lose weight. Your health  care provider may prescribe medicine if you are unable to lose 1 pound a week after 6 weeks of eating more healthily and doing more physical activity.  Surgery. Surgical  options may include gastric banding and gastric bypass. Surgery may be done if: ? Other treatments have not helped to improve your condition. ? You have a BMI of 40 or higher. ? You have life-threatening health problems related to obesity. Follow these instructions at home:  Eating and drinking   Follow recommendations from your health care provider about what you eat and drink. Your health care provider may advise you to: ? Limit fast foods, sweets, and processed snack foods. ? Choose low-fat options, such as low-fat milk instead of whole milk. ? Eat 5 or more servings of fruits or vegetables every day. ? Eat at home more often. This gives you more control over what you eat. ? Choose healthy foods when you eat out. ? Learn what a healthy portion size is. ? Keep low-fat snacks on hand. ? Avoid sugary drinks, such as soda, fruit juice, iced tea sweetened with sugar, and flavored milk. ? Eat a healthy breakfast.  Drink enough water to keep your urine clear or pale yellow.  Do not go without eating for long periods of time (do not fast) or follow a fad diet. Fasting and fad diets can be unhealthy and even dangerous. Physical Activity  Exercise regularly, as told by your health care provider. Ask your health care provider what types of exercise are safe for you and how often you should exercise.  Warm up and stretch before being active.  Cool down and stretch after being active.  Rest between periods of activity. Lifestyle  Limit the time that you spend in front of your TV, computer, or video game system.  Find ways to reward yourself that do not involve food.  Limit alcohol intake to no more than 1 drink a day for nonpregnant women and 2 drinks a day for men. One drink equals 12 oz of beer, 5 oz of wine, or 1 oz of hard liquor. General instructions  Keep a weight loss journal to keep track of the food you eat and how much you exercise you get.  Take over-the-counter and  prescription medicines only as told by your health care provider.  Take vitamins and supplements only as told by your health care provider.  Consider joining a support group. Your health care provider may be able to recommend a support group.  Keep all follow-up visits as told by your health care provider. This is important. Contact a health care provider if:  You are unable to meet your weight loss goal after 6 weeks of dietary and lifestyle changes. This information is not intended to replace advice given to you by your health care provider. Make sure you discuss any questions you have with your health care provider. Document Released: 03/10/2004 Document Revised: 07/06/2015 Document Reviewed: 11/19/2014 Elsevier Interactive Patient Education  2019 [employer]  Preventing Unhealthy [employer], Adult Staying at a healthy weight is important to your overall health. When fat builds up in your body, you may become overweight or obese. Being overweight or obese increases your risk of developing certain health problems, such as heart disease, diabetes, sleeping problems, joint problems, and some types of cancer. Unhealthy weight gain is often the result of making unhealthy food choices or not getting enough exercise. You can make changes to your lifestyle to prevent obesity and stay as healthy as  possible. What nutrition changes can be made?   Eat only as much as your body needs. To do this: ? Pay attention to signs that you are hungry or full. Stop eating as soon as you feel full. ? If you feel hungry, try drinking water first before eating. Drink enough water so your urine is clear or pale yellow. ? Eat smaller portions. Pay attention to portion sizes when eating out. ? Look at serving sizes on food labels. Most foods contain more than one serving per container. ? Eat the recommended number of calories for your gender and activity level. For most active people, a daily total of 2,000  calories is appropriate. If you are trying to lose weight or are not very active, you may need to eat fewer calories. Talk with your health care provider or a diet and nutrition specialist (dietitian) about how many calories you need each day.  Choose healthy foods, such as: ? Fruits and vegetables. At each meal, try to fill at least half of your plate with fruits and vegetables. ? Whole grains, such as whole-wheat bread, brown rice, and quinoa. ? Lean meats, such as chicken or fish. ? Other healthy proteins, such as beans, eggs, or tofu. ? Healthy fats, such as nuts, seeds, fatty fish, and olive oil. ? Low-fat or fat-free dairy products.  Check food labels, and avoid food and drinks that: ? Are high in calories. ? Have added sugar. ? Are high in sodium. ? Have saturated fats or trans fats.  Cook foods in healthier ways, such as by baking, broiling, or grilling.  Make a meal plan for the week, and shop with a grocery list to help you stay on track with your purchases. Try to avoid going to the grocery store when you are hungry.  When grocery shopping, try to shop around the outside of the store first, where the fresh foods are. Doing this helps you to avoid prepackaged foods, which can be high in sugar, salt (sodium), and fat. What lifestyle changes can be made?   Exercise for 30 or more minutes on 5 or more days each week. Exercising may include brisk walking, yard work, biking, running, swimming, and team sports like basketball and soccer. Ask your health care provider which exercises are safe for you.  Do muscle-strengthening activities, such as lifting weights or using resistance bands, on 2 or more days a week.  Do not use any products that contain nicotine or tobacco, such as cigarettes and e-cigarettes. If you need help quitting, ask your health care provider.  Limit alcohol intake to no more than 1 drink a day for nonpregnant women and 2 drinks a day for men. One drink equals 12  oz of beer, 5 oz of wine, or 1 oz of hard liquor.  Try to get 7-9 hours of sleep each night. What other changes can be made?  Keep a food and activity journal to keep track of: ? What you ate and how many calories you had. Remember to count the calories in sauces, dressings, and side dishes. ? Whether you were active, and what exercises you did. ? Your calorie, weight, and activity goals.  Check your weight regularly. Track any changes. If you notice you have gained weight, make changes to your diet or activity routine.  Avoid taking weight-loss medicines or supplements. Talk to your health care provider before starting any new medicine or supplement.  Talk to your health care provider before trying any new diet or  exercise plan. Why are these changes important? Eating healthy, staying active, and having healthy habits can help you to prevent obesity. Those changes also:  Help you manage stress and emotions.  Help you connect with friends and family.  Improve your self-esteem.  Improve your sleep.  Prevent long-term health problems. What can happen if changes are not made? Being obese or overweight can cause you to develop joint or bone problems, which can make it hard for you to stay active or do activities you enjoy. Being obese or overweight also puts stress on your heart and lungs and can lead to health problems like diabetes, heart disease, and some cancers. Where to find more information Talk with your health care provider or a dietitian about healthy eating and healthy lifestyle choices. You may also find information from:  [place] Department of Agriculture, MyPlate: https://ball-collins.biz/  American Heart Association: www.heart.org  Centers for Disease Control and Prevention: FootballExhibition.com.br Summary  Staying at a healthy weight is important to your overall health. It helps you to prevent certain diseases and health problems, such as heart disease, diabetes, joint problems,  sleep disorders, and some types of cancer.  Being obese or overweight can cause you to develop joint or bone problems, which can make it hard for you to stay active or do activities you enjoy.  You can prevent unhealthy weight gain by eating a healthy diet, exercising regularly, not smoking, limiting alcohol, and getting enough sleep.  Talk with your health care provider or a dietitian for guidance about healthy eating and healthy lifestyle choices. This information is not intended to replace advice given to you by your health care provider. Make sure you discuss any questions you have with your health care provider. Document Released: 02/02/2016 Document Revised: 11/11/2016 Document Reviewed: 03/09/2016 Elsevier Interactive Patient Education  2019 [employer].  Health Maintenance, Male A healthy lifestyle and preventive care is important for your health and wellness. Ask your health care provider about what schedule of regular examinations is right for you. What should I know about weight and diet? Eat a Healthy Diet  Eat plenty of vegetables, fruits, whole grains, low-fat dairy products, and lean protein.  Do not eat a lot of foods high in solid fats, added sugars, or salt.  Maintain a Healthy Weight Regular exercise can help you achieve or maintain a healthy weight. You should:  Do at least 150 minutes of exercise each week. The exercise should increase your heart rate and make you sweat (moderate-intensity exercise).  Do strength-training exercises at least twice a week. Watch Your Levels of Cholesterol and Blood Lipids  Have your blood tested for lipids and cholesterol every 5 years starting at 40 years of age. If you are at high risk for heart disease, you should start having your blood tested when you are 40 years old. You may need to have your cholesterol levels checked more often if: ? Your lipid or cholesterol levels are high. ? You are older than 40 years of age. ? You are  at high risk for heart disease. What should I know about cancer screening? Many types of cancers can be detected early and may often be prevented. Lung Cancer  You should be screened every year for lung cancer if: ? You are a current smoker who has smoked for at least 30 years. ? You are a former smoker who has quit within the past 15 years.  Talk to your health care provider about your screening options, when you should  start screening, and how often you should be screened. Colorectal Cancer  Routine colorectal cancer screening usually begins at 40 years of age and should be repeated every 5-10 years until you are 40 years old. You may need to be screened more often if early forms of precancerous polyps or small growths are found. Your health care provider may recommend screening at an earlier age if you have risk factors for colon cancer.  Your health care provider may recommend using home test kits to check for hidden blood in the stool.  A small camera at the end of a tube can be used to examine your colon (sigmoidoscopy or colonoscopy). This checks for the earliest forms of colorectal cancer. Prostate and Testicular Cancer  Depending on your age and overall health, your health care provider may do certain tests to screen for prostate and testicular cancer.  Talk to your health care provider about any symptoms or concerns you have about testicular or prostate cancer. Skin Cancer  Check your skin from head to toe regularly.  Tell your health care provider about any new moles or changes in moles, especially if: ? There is a change in a mole's size, shape, or color. ? You have a mole that is larger than a pencil eraser.  Always use sunscreen. Apply sunscreen liberally and repeat throughout the day.  Protect yourself by wearing long sleeves, pants, a wide-brimmed hat, and sunglasses when outside. What should I know about heart disease, diabetes, and high blood pressure?  If you are  39-72 years of age, have your blood pressure checked every 3-5 years. If you are 28 years of age or older, have your blood pressure checked every year. You should have your blood pressure measured twice-once when you are at a hospital or clinic, and once when you are not at a hospital or clinic. Record the average of the two measurements. To check your blood pressure when you are not at a hospital or clinic, you can use: ? An automated blood pressure machine at a pharmacy. ? A home blood pressure monitor.  Talk to your health care provider about your target blood pressure.  If you are between 2-62 years old, ask your health care provider if you should take aspirin to prevent heart disease.  Have regular diabetes screenings by checking your fasting blood sugar level. ? If you are at a normal weight and have a low risk for diabetes, have this test once every three years after the age of 70. ? If you are overweight and have a high risk for diabetes, consider being tested at a younger age or more often.  A one-time screening for abdominal aortic aneurysm (AAA) by ultrasound is recommended for men aged 65-75 years who are current or former smokers. What should I know about preventing infection? Hepatitis B If you have a higher risk for hepatitis B, you should be screened for this virus. Talk with your health care provider to find out if you are at risk for hepatitis B infection. Hepatitis C Blood testing is recommended for:  Everyone born from [birth year] through 1967.  Anyone with known risk factors for hepatitis C. Sexually Transmitted Diseases (STDs)  You should be screened each year for STDs including gonorrhea and chlamydia if: ? You are sexually active and are younger than 40 years of age. ? You are older than 40 years of age and your health care provider tells you that you are at risk for this type of infection. ?  Your sexual activity has changed since you were last screened and you are at an  increased risk for chlamydia or gonorrhea. Ask your health care provider if you are at risk.  Talk with your health care provider about whether you are at high risk of being infected with HIV. Your health care provider may recommend a prescription medicine to help prevent HIV infection. What else can I do?  Schedule regular health, dental, and eye exams.  Stay current with your vaccines (immunizations).  Do not use any tobacco products, such as cigarettes, chewing tobacco, and e-cigarettes. If you need help quitting, ask your health care provider.  Limit alcohol intake to no more than 2 drinks per day. One drink equals 12 ounces of beer, 5 ounces of wine, or 1 ounces of hard liquor.  Do not use street drugs.  Do not share needles.  Ask your health care provider for help if you need support or information about quitting drugs.  Tell your health care provider if you often feel depressed.  Tell your health care provider if you have ever been abused or do not feel safe at home. This information is not intended to replace advice given to you by your health care provider. Make sure you discuss any questions you have with your health care provider. Document Released: 07/30/2007 Document Revised: 09/30/2015 Document Reviewed: 11/04/2014 Elsevier Interactive Patient Education  2019 [employer]

## 2018-02-28 NOTE — Assessment & Plan Note (Signed)
USPSTF grade A and B recommendations reviewed with patient; age-appropriate recommendations, preventive care, screening tests, etc discussed and encouraged; healthy living encouraged; see AVS for patient education given to patient  

## 2018-02-28 NOTE — Assessment & Plan Note (Signed)
Refer to nutritionist; see AVS; cut back on sugary drinks, limit portions

## 2018-02-28 NOTE — Progress Notes (Signed)
BP 124/82   Pulse 71   Temp 97.6 F (36.4 C) (Oral)   Ht 5\' 8"  (1.727 m)   Wt 233 lb 12.8 oz (106.1 kg)   SpO2 96%   BMI 35.55 kg/m    Subjective:    Patient ID: Jose Lowe, male    DOB: [birth year], 38 y.o.   MRN: [id number]  HPI: Jose Jester is a 38 y.o. male  Chief Complaint  Patient presents with  . Annual Exam    HPI Here for physical 3rd shift, long hours, gaining weight over the last year; 18 pounds over the last 14 months or so Drinking more water; will try to drink less sweet tea Started back exercise, on treadmill  USPSTF grade A and B recommendations Depression:  [facility] 02/28/2018 12/19/2016 12/10/2015 11/10/2014  Decreased Interest 0 0 0 0  Down, Depressed, Hopeless 0 0 0 0  PHQ - 2 Score 0 0 0 0  Altered sleeping 0 - - -  Tired, decreased energy 0 - - -  Change in appetite 0 - - -  Feeling bad or failure about yourself  0 - - -  Trouble concentrating 0 - - -  Moving slowly or fidgety/restless 0 - - -  Suicidal thoughts 0 - - -  PHQ-9 Score 0 - - -  Difficult doing work/chores Not difficult at all - - -   Hypertension: BP Readings from Last 3 Encounters:  02/28/18 124/82  10/02/17 (!) 136/92  12/19/16 116/78   Obesity: Wt Readings from Last 3 Encounters:  02/28/18 233 lb 12.8 oz (106.1 kg)  10/02/17 225 lb (102.1 kg)  12/19/16 215 lb 8 oz (97.8 kg)   BMI Readings from Last 3 Encounters:  02/28/18 35.55 kg/m  10/02/17 34.21 kg/m  12/19/16 32.53 kg/m    Immunizations: flu shot UTD, tetanus UTD Skin cancer: no worrisome moles Lung cancer:  nonsmoker Prostate cancer: father had prostate cancer No results found for: PSA Colorectal cancer: start 38 years of age AAA: na/ Aspirin: n/a Diet: working on eating better Exercise: getting back on treadmill Alcohol:    [facility]  AUDIT-C Score  3    not more than 14 drinks in a week; 2 a week Tobacco use: never HIV, hep B,  hep C: not interested STD testing and prevention (chl/gon/syphilis): not interested Glucose:  Glucose  Date Value Ref Range Status  11/10/2014 83 65 - 99 mg/dL Final   Glucose, Bld  Date Value Ref Range Status  12/19/2016 91 65 - 99 mg/dL Final    Comment:    .            Fasting reference interval .    Lipids:  Lab Results  Component Value Date   CHOL 212 (H) 12/19/2016   CHOL 200 (H) 11/10/2014   Lab Results  Component Value Date   HDL 39 (L) 12/19/2016   HDL 31 (L) 11/10/2014   Lab Results  Component Value Date   LDLCALC 145 (H) 12/19/2016   LDLCALC 126 (H) 11/10/2014   Lab Results  Component Value Date   TRIG 153 (H) 12/19/2016   TRIG 216 (H) 11/10/2014   Lab Results  Component Value Date   CHOLHDL 5.4 (H) 12/19/2016   No results found for: LDLDIRECT    [facility] 02/28/2018 12/19/2016 12/10/2015 11/10/2014  Decreased Interest 0 0 0 0  Down, Depressed, Hopeless 0 0 0 0  PHQ - 2 Score 0 0 0 0  Altered sleeping 0 - - -  Tired, decreased energy 0 - - -  Change in appetite 0 - - -  Feeling bad or failure about yourself  0 - - -  Trouble concentrating 0 - - -  Moving slowly or fidgety/restless 0 - - -  Suicidal thoughts 0 - - -  PHQ-9 Score 0 - - -  Difficult doing work/chores Not difficult at all - - -   Fall Risk  02/28/2018 12/19/2016 12/10/2015  Falls in the past year? 0 No No  Number falls in past yr: 0 - -  Injury with Fall? 0 - -    Relevant past medical, surgical, family and social history reviewed Past Medical History:  Diagnosis Date  . Allergy    Seasonal   Past Surgical History:  Procedure Laterality Date  . ANKLE SURGERY Right 1996  . VASECTOMY  2015   Family History  Problem Relation Age of Onset  . Hypertension Mother   . Hyperlipidemia Mother   . Hypertension Father   . Cancer Father 32       prostate  . Cancer Maternal Uncle        prostate  . Cancer Maternal Grandmother        breast  . Diabetes Maternal  Grandmother   . Stroke Maternal Grandmother   . COPD Paternal Grandmother   . Cancer Paternal Grandmother        Lung Cancer  . COPD Paternal Grandfather   . Cancer Paternal Grandfather        Lung Cancer  . Schizophrenia Brother   . Asthma Daughter        Mild  . Heart disease Neg Hx    Social History   Tobacco Use  . Smoking status: Never Smoker  . Smokeless tobacco: Never Used  Substance Use Topics  . Alcohol use: Yes    Comment: occasionally  . Drug use: No     [facility] Visit from 02/28/2018 in [facility]  AUDIT-C Score  3      Interim medical history since last visit reviewed. Allergies and medications reviewed  Review of Systems  Constitutional: Positive for unexpected weight change.  HENT: Negative for hearing loss.   Eyes: Negative for visual disturbance.  Cardiovascular: Negative for chest pain and palpitations.  Gastrointestinal: Negative for blood in stool.       Acid reflux; when he eats, he does not let his food digest before he gets in bed; not digesting food right; 3 months duration  Endocrine: Negative for polydipsia.  Genitourinary: Negative for hematuria.  Musculoskeletal: Negative for back pain and joint swelling.  Skin: Negative for rash.  Allergic/Immunologic: Negative for food allergies.  Neurological: Negative for tremors.  Hematological: Negative for adenopathy. Does not bruise/bleed easily.  Psychiatric/Behavioral: Negative for dysphoric mood.   Per HPI unless specifically indicated above     Objective:    BP 124/82   Pulse 71   Temp 97.6 F (36.4 C) (Oral)   Ht 5\' 8"  (1.727 m)   Wt 233 lb 12.8 oz (106.1 kg)   SpO2 96%   BMI 35.55 kg/m   Wt Readings from Last 3 Encounters:  02/28/18 233 lb 12.8 oz (106.1 kg)  10/02/17 225 lb (102.1 kg)  12/19/16 215 lb 8 oz (97.8 kg)    Physical Exam Constitutional:      General: He is not in acute distress.  Appearance: He is well-developed. He is not diaphoretic.    HENT:     Head: Normocephalic and atraumatic.     Nose: Nose normal.  Eyes:     General: No scleral icterus. Neck:     Thyroid: No thyromegaly.     Vascular: No JVD.  Cardiovascular:     Rate and Rhythm: Normal rate and regular rhythm.     Heart sounds: Normal heart sounds.  Pulmonary:     Effort: Pulmonary effort is normal. No respiratory distress.     Breath sounds: Normal breath sounds. No wheezing or rales.  Abdominal:     General: Bowel sounds are normal. There is no distension.     Palpations: Abdomen is soft.     Tenderness: There is no abdominal tenderness. There is no guarding.  Musculoskeletal: Normal range of motion.  Lymphadenopathy:     Cervical: No cervical adenopathy.  Skin:    General: Skin is warm and dry.     Coloration: Skin is not pale.     Findings: No erythema or rash.  Neurological:     Mental Status: He is alert.     Motor: No abnormal muscle tone.     Coordination: Coordination normal.     Deep Tendon Reflexes: Reflexes normal.  Psychiatric:        Behavior: Behavior normal.        Thought Content: Thought content normal.        Judgment: Judgment normal.     Results for orders placed or performed in visit on 12/19/16  Hepatitis panel, acute  Result Value Ref Range   Hep A IgM NON-REACTIVE NON-REACTI   Hepatitis B Surface Ag NON-REACTIVE NON-REACTI   Hep B C IgM NON-REACTIVE NON-REACTI   Hepatitis C Ab NON-REACTIVE NON-REACTI   SIGNAL TO CUT-OFF 0.03 <1.00  Lipid panel  Result Value Ref Range   Cholesterol 212 (H) <200 mg/dL   HDL 39 (L) >53 mg/dL   Triglycerides 664 (H) <150 mg/dL   LDL Cholesterol (Calc) 145 (H) mg/dL (calc)   Total CHOL/HDL Ratio 5.4 (H) <5.0 (calc)   Non-HDL Cholesterol (Calc) 173 (H) <130 mg/dL (calc)  COMPLETE METABOLIC PANEL WITH GFR  Result Value Ref Range   Glucose, Bld 91 65 - 99 mg/dL   BUN 11 7 - 25 mg/dL   Creat 4.03 4.74 - 2.59 mg/dL   GFR, Est Non African American 73 > OR = 60 mL/min/1.78m2   GFR,  Est African American 84 > OR = 60 mL/min/1.60m2   BUN/Creatinine Ratio NOT APPLICABLE 6 - 22 (calc)   Sodium 138 135 - 146 mmol/L   Potassium 4.1 3.5 - 5.3 mmol/L   Chloride 103 98 - 110 mmol/L   CO2 29 20 - 32 mmol/L   Calcium 9.7 8.6 - 10.3 mg/dL   Total Protein 7.5 6.1 - 8.1 g/dL   Albumin 4.4 3.6 - 5.1 g/dL   Globulin 3.1 1.9 - 3.7 g/dL (calc)   AG Ratio 1.4 1.0 - 2.5 (calc)   Total Bilirubin 0.5 0.2 - 1.2 mg/dL   Alkaline phosphatase (APISO) 74 40 - 115 U/L   AST 25 10 - 40 U/L   ALT 33 9 - 46 U/L  TSH  Result Value Ref Range   TSH 2.32 0.40 - 4.50 mIU/L  CBC with Differential/Platelet  Result Value Ref Range   WBC 7.1 3.8 - 10.8 Thousand/uL   RBC 5.17 4.20 - 5.80 Million/uL   Hemoglobin 16.0 13.2 - 17.1  g/dL   HCT 54.044.9 98.138.5 - 19.150.0 %   MCV 86.8 80.0 - 100.0 fL   MCH 30.9 27.0 - 33.0 pg   MCHC 35.6 32.0 - 36.0 g/dL   RDW 47.812.9 29.511.0 - 62.115.0 %   Platelets 223 140 - 400 Thousand/uL   MPV 12.9 (H) 7.5 - 12.5 fL   Neutro Abs 3,742 1,500 - 7,800 cells/uL   Lymphs Abs 2,577 850 - 3,900 cells/uL   WBC mixed population 632 200 - 950 cells/uL   Eosinophils Absolute 107 15 - 500 cells/uL   Basophils Absolute 43 0 - 200 cells/uL   Neutrophils Relative % 52.7 %   Total Lymphocyte 36.3 %   Monocytes Relative 8.9 %   Eosinophils Relative 1.5 %   Basophils Relative 0.6 %  HIV antibody  Result Value Ref Range   HIV 1&2 Ab, 4th Generation NON-REACTIVE NON-REACTI  RPR  Result Value Ref Range   RPR Ser Ql NON-REACTIVE NON-REACTI      Assessment & Plan:   Problem List Items Addressed This Visit      Digestive   Acid reflux    Avoid eating for 3 hours before bedtime, elevate HOB; start H2 blocker; let me know if not getting better      Relevant Medications   famotidine (PEPCID) 20 MG tablet     Other   Preventative health care - Primary    USPSTF grade A and B recommendations reviewed with patient; age-appropriate recommendations, preventive care, screening tests, etc  discussed and encouraged; healthy living encouraged; see AVS for patient education given to patient       Relevant Orders   CBC   COMPLETE METABOLIC PANEL WITH GFR   TSH   Lipid panel   Obesity    Refer to nutritionist; see AVS; cut back on sugary drinks, limit portions      Relevant Orders   Amb ref to Medical Nutrition Therapy-MNT    Other Visit Diagnoses    Need for influenza vaccination       Relevant Orders   Flu Vaccine QUAD 6+ mos PF IM (Fluarix Quad PF) (Completed)       Follow up plan: Return in about 1 year (around 03/01/2019) for complete physical.  An after-visit summary was printed and given to the patient at check-out.  Please see the patient instructions which may contain other information and recommendations beyond what is mentioned above in the assessment and plan.  Meds ordered this encounter  Medications  . famotidine (PEPCID) 20 MG tablet    Sig: Take 1 tablet (20 mg total) by mouth 2 (two) times daily as needed for heartburn or indigestion.    Dispense:  60 tablet    Refill:  2    Orders Placed This Encounter  Procedures  . Flu Vaccine QUAD 6+ mos PF IM (Fluarix Quad PF)  . CBC  . COMPLETE METABOLIC PANEL WITH GFR  . TSH  . Lipid panel  . Amb ref to Medical Nutrition Therapy-MNT

## 2018-03-01 ENCOUNTER — Other Ambulatory Visit: Payer: Self-pay | Admitting: Family Medicine

## 2018-03-01 DIAGNOSIS — R7989 Other specified abnormal findings of blood chemistry: Secondary | ICD-10-CM
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
## 2019-01-25 ENCOUNTER — Encounter: Payer: Self-pay | Admitting: Family Medicine

## 2019-03-04 ENCOUNTER — Encounter: Payer: 59 | Admitting: Family Medicine

## 2019-04-12 ENCOUNTER — Other Ambulatory Visit: Payer: Self-pay

## 2019-04-12 ENCOUNTER — Ambulatory Visit (INDEPENDENT_AMBULATORY_CARE_PROVIDER_SITE_OTHER): Payer: 59 | Admitting: Family Medicine

## 2019-04-12 ENCOUNTER — Encounter: Payer: Self-pay | Admitting: Family Medicine

## 2019-04-12 VITALS — BP 122/72 | HR 94 | Temp 97.8°F | Resp 14 | Ht 68.0 in | Wt 233.9 lb

## 2019-04-12 DIAGNOSIS — E785 Hyperlipidemia, unspecified: Secondary | ICD-10-CM | POA: Insufficient documentation

## 2019-04-12 DIAGNOSIS — Z13228 Encounter for screening for other metabolic disorders: Secondary | ICD-10-CM

## 2019-04-12 DIAGNOSIS — Z13220 Encounter for screening for lipoid disorders: Secondary | ICD-10-CM | POA: Diagnosis not present

## 2019-04-12 DIAGNOSIS — E6609 Other obesity due to excess calories: Secondary | ICD-10-CM

## 2019-04-12 DIAGNOSIS — Z0000 Encounter for general adult medical examination without abnormal findings: Secondary | ICD-10-CM | POA: Diagnosis not present

## 2019-04-12 DIAGNOSIS — Z6835 Body mass index (BMI) 35.0-35.9, adult: Secondary | ICD-10-CM

## 2019-04-12 DIAGNOSIS — Z8042 Family history of malignant neoplasm of prostate: Secondary | ICD-10-CM | POA: Insufficient documentation

## 2019-04-12 DIAGNOSIS — R7989 Other specified abnormal findings of blood chemistry: Secondary | ICD-10-CM

## 2019-04-12 DIAGNOSIS — Z1329 Encounter for screening for other suspected endocrine disorder: Secondary | ICD-10-CM | POA: Diagnosis not present

## 2019-04-12 DIAGNOSIS — Z130 Encounter for screening for diseases of the blood and blood-forming organs and certain disorders involving the immune mechanism: Secondary | ICD-10-CM

## 2019-04-12 NOTE — Progress Notes (Signed)
Patient: Jose Lowe, Male    DOB: [birth year], 39 y.o.   MRN: [id number] Danelle Berryapia, [name], PA-C Visit Date: 04/12/2019  Today's Provider: Danelle BerryLeisa Aine Strycharz, PA-C   Chief Complaint  Patient presents with  . Annual Exam   Subjective:   Annual physical exam:  Jose Lowe is a 39 y.o. male who presents today for health maintenance and annual & complete physical exam.  He feels his health is good  He reports exercising - trying to get back on his treadmill, he does a lot of walking at his job - 6000 steps a day Diet - no big changes in the last year, some fried and baked foods, salads, eats out 3 times a week   He reports he is sleeping well.  HLD - very high last year Hyperlipidemia: Current Medication Regimen:  Nothing, cholesterol has been high for years, overall ASCVD risk still low Last Lipids: Lab Results  Component Value Date   CHOL 215 (H) 02/28/2018   HDL 37 (L) 02/28/2018   LDLCALC 154 (H) 02/28/2018   TRIG 121 02/28/2018   CHOLHDL 5.8 (H) 02/28/2018  - Current Diet:  Poor - eats out 3x a week, fried food/fast food - Denies: Chest pain, shortness of breath, myalgias. - Documented aortic atherosclerosis? No - Risk factors for atherosclerosis: hypercholesterolemia   - TSH high at last physical, was supposed to f/up but lost to f/up with PCP leaving and COVID pandemic Lab Results  Component Value Date   TSH 4.88 (H) 02/28/2018  no past thyroid problems, no family hx of thyroid disease, weight increase, but denies skin/hair changes, constipation, mood changes  Recently had COVID - Dec, had respirator sx, fever body aches, feels he completely improved in a few weeks   USPSTF grade A and B recommendations - reviewed and addressed today  Depression:  Phq 9 completed today by patient, was reviewed by me with patient in the room, score is  negative, pt feels good PHQ 2/9 Scores 04/12/2019 02/28/2018 12/19/2016 12/10/2015  PHQ - 2 Score 0 0 0 0  PHQ- 9 Score 0 0 - -   Depression  [facility] 04/12/2019 02/28/2018 12/19/2016 12/10/2015 11/10/2014  Decreased Interest 0 0 0 0 0  Down, Depressed, Hopeless 0 0 0 0 0  PHQ - 2 Score 0 0 0 0 0  Altered sleeping 0 0 - - -  Tired, decreased energy 0 0 - - -  Change in appetite 0 0 - - -  Feeling bad or failure about yourself  0 0 - - -  Trouble concentrating 0 0 - - -  Moving slowly or fidgety/restless 0 0 - - -  Suicidal thoughts 0 0 - - -  PHQ-9 Score 0 0 - - -  Difficult doing work/chores Not difficult at all Not difficult at all - - -    STD testing and prevention (HIV/chl/gon/syphilis):  No need for STD testing, monogomous  Prostate cancer: Prostate cancer screening with PSA: Discussed risks and benefits of PSA testing and provided handout. Will not have PSA drawn today after lengthy discussion on limits of PSA - hand out given on prostate cancer and PSA lab screening - reviewed at length  No results found for: PSA Intimate partner violence:  Denies feels safe  Urinary Symptoms:  IPSS Questionnaire (AUA-7): Over the past month.   1)  How often have you had a sensation of not emptying your bladder completely after you finish urinating?  0 - Not at  all  2)  How often have you had to urinate again less than two hours after you finished urinating? 0 - Not at all  3)  How often have you found you stopped and started again several times when you urinated?  0 - Not at all  4) How difficult have you found it to postpone urination?  0 - Not at all  5) How often have you had a weak urinary stream?  0 - Not at all  6) How often have you had to push or strain to begin urination?  0 - Not at all  7) How many times did you most typically get up to urinate from the time you went to bed until the time you got up in the morning?  1 - 1 time  Total score:  1 -  mildly symptomatic   Advanced Care Planning:  A voluntary discussion about advance care planning including the explanation and discussion of advance directives.  No current  paperwork done - emergency contact per chart - wife  Skin cancer:  Pt reports no hx of skin cancer, suspicious lesions/biopsies in the past. Atypical lesions discussed  Colorectal cancer:  colonoscopy is due at 39 y/o Denies melena hematochezia change in BM pattern or caliber  Lung cancer:   Low Dose CT Chest recommended if Age 21-80 years, 30 pack-year currently smoking OR have quit w/in 15years. Patient does not qualify.   Social History   Tobacco Use  . Smoking status: Never Smoker  . Smokeless tobacco: Never Used  Substance Use Topics  . Alcohol use: Yes    Comment: occasionally     Alcohol screening:   Office Visit from 04/12/2019 in [facility]  AUDIT-C Score  0      AAA: n/a The USPSTF recommends one-time screening with ultrasonography in men ages 76 to 9 years who have ever smoked  TZG:YFVC today  Blood pressure/Hypertension: BP Readings from Last 3 Encounters:  04/12/19 122/72  02/28/18 124/82  10/02/17 (!) 136/92   Weight/Obesity: Wt Readings from Last 3 Encounters:  04/12/19 233 lb 14.4 oz (106.1 kg)  02/28/18 233 lb 12.8 oz (106.1 kg)  10/02/17 225 lb (102.1 kg)   BMI Readings from Last 3 Encounters:  04/12/19 35.56 kg/m  02/28/18 35.55 kg/m  10/02/17 34.21 kg/m    Lipids:  Lab Results  Component Value Date   CHOL 215 (H) 02/28/2018   CHOL 212 (H) 12/19/2016   CHOL 200 (H) 11/10/2014   Lab Results  Component Value Date   HDL 37 (L) 02/28/2018   HDL 39 (L) 12/19/2016   HDL 31 (L) 11/10/2014   Lab Results  Component Value Date   LDLCALC 154 (H) 02/28/2018   LDLCALC 145 (H) 12/19/2016   LDLCALC 126 (H) 11/10/2014   Lab Results  Component Value Date   TRIG 121 02/28/2018   TRIG 153 (H) 12/19/2016   TRIG 216 (H) 11/10/2014   Lab Results  Component Value Date   CHOLHDL 5.8 (H) 02/28/2018   CHOLHDL 5.4 (H) 12/19/2016   No results found for: LDLDIRECT Based on the results of lipid panel his/her cardiovascular risk  factor ( using Po[place] Cohort )  in the next 10 years is : The 10-year ASCVD risk score Denman George DC Montez Hageman., et al., 2013) is: 3.4%   Values used to calculate the score:     Age: 89 years     Sex: Male     Is Non-Hispanic African American: Yes  Diabetic: No     Tobacco smoker: No     Systolic Blood Pressure: 122 mmHg     Is BP treated: No     HDL Cholesterol: 37 mg/dL     Total Cholesterol: 215 mg/dL Glucose:  Glucose  Date Value Ref Range Status  11/10/2014 83 65 - 99 mg/dL Final   Glucose, Bld  Date Value Ref Range Status  02/28/2018 82 65 - 99 mg/dL Final    Comment:    .            Fasting reference interval .   12/19/2016 91 65 - 99 mg/dL Final    Comment:    .            Fasting reference interval .     Social History      He  reports that he has never smoked. He has never used smokeless tobacco. He reports current alcohol use. He reports that he does not use drugs.             Social History   Socioeconomic History  . Marital status: Married    Spouse name: Jarrah Babich  . Number of children: 2  . Years of education: 5  . Highest education level: High school graduate  Occupational History  . Occupation: Retail buyer: [employer]  Tobacco Use  . Smoking status: Never Smoker  . Smokeless tobacco: Never Used  Substance and Sexual Activity  . Alcohol use: Yes    Comment: occasionally  . Drug use: No  . Sexual activity: Yes    Partners: Female  Other Topics Concern  . Not on file  Social History Narrative  . Not on file   Social Determinants of Health   Financial Resource Strain:   . Difficulty of Paying Living Expenses: Not on file  Food Insecurity:   . Worried About Programme researcher, broadcasting/film/video in the Last Year: Not on file  . Ran Out of Food in the Last Year: Not on file  Transportation Needs:   . Lack of Transportation (Medical): Not on file  . Lack of Transportation (Non-Medical): Not on file  Physical Activity:   . Days of Exercise per  Week: Not on file  . Minutes of Exercise per Session: Not on file  Stress:   . Feeling of Stress : Not on file  Social Connections:   . Frequency of Communication with Friends and Family: Not on file  . Frequency of Social Gatherings with Friends and Family: Not on file  . Attends Religious Services: Not on file  . Active Member of Clubs or Organizations: Not on file  . Attends Banker Meetings: Not on file  . Marital Status: Not on file  Intimate Partner Violence:   . Fear of Current or Ex-Partner: Not on file  . Emotionally Abused: Not on file  . Physically Abused: Not on file  . Sexually Abused: Not on file    Family History        Family Status  Relation Name Status  . Mother  Alive  . Father  Alive  . Mat [employer]  . MGM  Deceased  . PGM  Deceased  . PGF  Deceased  . Brother  Alive  . Daughter  Alive  . Son  Alive  . Neg Hx  (Not Specified)        His family history includes Asthma in his daughter; COPD in his paternal  grandfather and paternal grandmother; Cancer in his maternal grandmother, maternal uncle, paternal grandfather, and paternal grandmother; Cancer (age of onset: 68) in his father; Diabetes in his maternal grandmother; Hyperlipidemia in his mother; Hypertension in his father and mother; Schizophrenia in his brother; Stroke in his maternal grandmother. There is no history of Heart disease.       Family History  Problem Relation Age of Onset  . Hypertension Mother   . Hyperlipidemia Mother   . Hypertension Father   . Cancer Father 88       prostate  . Cancer Maternal Uncle        prostate  . Cancer Maternal Grandmother        breast  . Diabetes Maternal Grandmother   . Stroke Maternal Grandmother   . COPD Paternal Grandmother   . Cancer Paternal Grandmother        Lung Cancer  . COPD Paternal Grandfather   . Cancer Paternal Grandfather        Lung Cancer  . Schizophrenia Brother   . Asthma Daughter        Mild  . Heart disease  Neg Hx     Patient Active Problem List   Diagnosis Date Noted  . Family history of prostate cancer in father 04/12/2019  . Elevated TSH 04/12/2019  . Hyperlipidemia 04/12/2019  . Acid reflux 02/28/2018  . 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 Preventative health care 11/10/2014  . Obesity 11/10/2014    Past Surgical History:  Procedure Laterality Date  . ANKLE SURGERY Right 1996  . VASECTOMY  2015     Current Outpatient Medications:  .  famotidine (PEPCID) 20 MG tablet, Take 1 tablet (20 mg total) by mouth 2 (two) times daily as needed for heartburn or indigestion., Disp: 60 tablet, Rfl: 2  Allergies  Allergen Reactions  . Latex Itching and Rash    Patient Care Team: Delsa Grana, PA-C as PCP - General (Family Medicine) Sanda Klein, Satira Anis, MD as Attending Physician (Family Medicine)  I personally reviewed active problem list, medication list, allergies, family history, social history, health maintenance, notes from last encounter, lab results, imaging with the patient/caregiver today.   Review of Systems  Constitutional: Negative.  Negative for activity change, appetite change, fatigue and unexpected weight change.  HENT: Negative.   Eyes: Negative.   Respiratory: Negative.  Negative for shortness of breath.   Cardiovascular: Negative.  Negative for chest pain, palpitations and leg swelling.  Gastrointestinal: Negative.  Negative for abdominal pain and blood in stool.  Endocrine: Negative.   Genitourinary: Negative.  Negative for decreased urine volume, difficulty urinating, testicular pain and urgency.  Skin: Negative.  Negative for color change and pallor.  Allergic/Immunologic: Negative.   Neurological: Negative.  Negative for syncope, weakness, light-headedness and numbness.  Psychiatric/Behavioral: Negative.  Negative for confusion, dysphoric mood, self-injury and suicidal ideas. The patient is not nervous/anxious.   All other systems reviewed and are negative.           Objective:   Vitals:  Vitals:   04/12/19 1454  BP: 122/72  Pulse: 94  Resp: 14  Temp: 97.8 F (36.6 C)  SpO2: 97%  Weight: 233 lb 14.4 oz (106.1 kg)  Height: 5\' 8"  (1.727 m)    Body mass index is 35.56 kg/m.  Physical Exam Vitals and nursing note reviewed.  Constitutional:      General: He is not in acute distress.    Appearance: Normal appearance. He is well-developed. He is  obese. He is not ill-appearing, toxic-appearing or diaphoretic.     Interventions: Face mask in place.  HENT:     Head: Normocephalic and atraumatic.     Jaw: No trismus.     Right Ear: External ear normal.     Left Ear: External ear normal.  Eyes:     General: Lids are normal. No scleral icterus.    Conjunctiva/sclera: Conjunctivae normal.     Pupils: Pupils are equal, round, and reactive to light.  Neck:     Thyroid: No thyroid mass, thyromegaly or thyroid tenderness.     Trachea: Trachea and phonation normal. No tracheal deviation.  Cardiovascular:     Rate and Rhythm: Normal rate and regular rhythm.     Pulses: Normal pulses.          Radial pulses are 2+ on the right side and 2+ on the left side.       Posterior tibial pulses are 2+ on the right side and 2+ on the left side.     Heart sounds: Normal heart sounds. No murmur. No friction rub. No gallop.   Pulmonary:     Effort: Pulmonary effort is normal. No respiratory distress.     Breath sounds: Normal breath sounds. No stridor. No wheezing, rhonchi or rales.  Abdominal:     General: Bowel sounds are normal. There is no distension.     Palpations: Abdomen is soft.     Tenderness: There is no abdominal tenderness. There is no guarding or rebound.  Musculoskeletal:        General: Normal range of motion.     Cervical back: Normal range of motion and neck supple.     Right lower leg: No edema.     Left lower leg: No edema.  Skin:    General: Skin is warm and dry.     Capillary Refill: Capillary refill takes less than 2 seconds.      Coloration: Skin is not jaundiced.     Findings: No rash.     Nails: There is no clubbing.     Comments: Some mild swelling to b/l hands  Neurological:     Mental Status: He is alert.     Cranial Nerves: No dysarthria or facial asymmetry.     Motor: No tremor or abnormal muscle tone.     Gait: Gait normal.  Psychiatric:        Mood and Affect: Mood normal.        Speech: Speech normal.        Behavior: Behavior normal. Behavior is cooperative.     No results found for this or any previous visit (from the past 2160 hour(s)).  PHQ2/9: Depression screen [facility] 2/9 04/12/2019 02/28/2018 12/19/2016 12/10/2015 11/10/2014  Decreased Interest 0 0 0 0 0  Down, Depressed, Hopeless 0 0 0 0 0  PHQ - 2 Score 0 0 0 0 0  Altered sleeping 0 0 - - -  Tired, decreased energy 0 0 - - -  Change in appetite 0 0 - - -  Feeling bad or failure about yourself  0 0 - - -  Trouble concentrating 0 0 - - -  Moving slowly or fidgety/restless 0 0 - - -  Suicidal thoughts 0 0 - - -  PHQ-9 Score 0 0 - - -  Difficult doing work/chores Not difficult at all Not difficult at all - - -    Fall Risk: Fall Risk  04/12/2019 02/28/2018 12/19/2016 12/10/2015  Falls in the past year? 0 0 No No  Number falls in past yr: 0 0 - -  Injury with Fall? 0 0 - -    Functional Status Survey: Is the patient deaf or have difficulty hearing?: No Does the patient have difficulty seeing, even when wearing glasses/contacts?: No Does the patient have difficulty concentrating, remembering, or making decisions?: No Does the patient have difficulty walking or climbing stairs?: No Does the patient have difficulty dressing or bathing?: No Does the patient have difficulty doing errands alone such as visiting a doctor's office or shopping?: No   Assessment & Plan:    CPE completed today  . Prostate cancer screening and PSA options (with potential risks and benefits of testing vs not testing) were discussed along with recent  recs/guidelines, shared decision making and handout/information given to pt today  . USPSTF grade A and B recommendations reviewed with patient; age-appropriate recommendations, preventive care, screening tests, etc discussed and encouraged; healthy living encouraged; see AVS for patient education given to patient  . Discussed importance of 150 minutes of physical activity weekly, AHA exercise recommendations given to pt in AVS/handout  . Discussed importance of healthy diet:  eating lean meats and proteins, avoiding trans fats and saturated fats, avoid simple sugars and excessive carbs in diet, eat 6 servings of fruit/vegetables daily and drink plenty of water and avoid sweet beverages.  DASH diet reviewed if pt has HTN  . Recommended pt to do annual eye exam and routine dental exams/cleanings  . Reviewed Health Maintenance: Health Maintenance  Topic Date Due  . TETANUS/TDAP  09/08/2020  . INFLUENZA VACCINE  Completed  . HIV Screening  Completed    . Immunizations: Immunization History  Administered Date(s) Administered  . Influenza,inj,Quad PF,6+ Mos 12/10/2015, 02/28/2018, 11/21/2018  . Influenza-Unspecified 11/08/2013, 10/16/2014, 11/21/2018  . Td 09/09/2010       ICD-10-CM   1. Adult general medical exam  Z00.00 CBC with Differential/Platelet    COMPLETE METABOLIC PANEL WITH GFR    Lipid panel    Hemoglobin A1c    TSH  2. Class 2 obesity due to excess calories without serious comorbidity with body mass index (BMI) of 35.0 to 35.9 in adult  E66.09 Hemoglobin A1c   Z68.35    encouraged healthier died, exercise recommendations given in handout  3. Screening for lipoid disorders  Z13.220 COMPLETE METABOLIC PANEL WITH GFR    Lipid panel  4. Screening for endocrine, metabolic and immunity disorder  Z13.29 CBC with Differential/Platelet   Z13.228 COMPLETE METABOLIC PANEL WITH GFR   Z13.0 Lipid panel    Hemoglobin A1c    TSH  5. Family history of prostate cancer in father   Z20.42    lengthy discussion about prostate CA, in dad and paternal grandfather, no LUTS currently, father dx in 17's around 86 he thinks, monitor LUTS, handout given  6. Elevated TSH  R79.89 TSH    T4, free   last TSH high, some swelling to hand, no other sx consistent with hypothyroid, will do repeat TSH and free T 4, discussed thyroid labs and function with pt  7. Hyperlipidemia, unspecified hyperlipidemia type  E78.5 COMPLETE METABOLIC PANEL WITH GFR    Lipid panel   high cholesterol, LDL near level of needing statin, but ASCVD risk low, discussed indications for statins, handouts given on diet/lifestyle changes and statin      Danelle Berry, PA-C 04/12/19 4:49 PM  [facility]

## 2019-04-12 NOTE — Patient Instructions (Addendum)
Preventive Care 41-41 Years Old, Male Preventive care refers to lifestyle choices and visits with your health care provider that can promote health and wellness. This includes:  A yearly physical exam. This is also called an annual well check.  Regular dental and eye exams.  Immunizations.  Screening for certain conditions.  Healthy lifestyle choices, such as eating a healthy diet, getting regular exercise, not using drugs or products that contain nicotine and tobacco, and limiting alcohol use. What can I expect for my preventive care visit? Physical exam Your health care provider will check:  Height and weight. These may be used to calculate body mass index (BMI), which is a measurement that tells if you are at a healthy weight.  Heart rate and blood pressure.  Your skin for abnormal spots. Counseling Your health care provider may ask you questions about:  Alcohol, tobacco, and drug use.  Emotional well-being.  Home and relationship well-being.  Sexual activity.  Eating habits.  Work and work environment. What immunizations do I need?  Influenza (flu) vaccine  This is recommended every year. Tetanus, diphtheria, and pertussis (Tdap) vaccine  You may need a Td booster every 10 years. Varicella (chickenpox) vaccine  You may need this vaccine if you have not already been vaccinated. Zoster (shingles) vaccine  You may need this after age 60. Measles, mumps, and rubella (MMR) vaccine  You may need at least one dose of MMR if you were born in 1957 or later. You may also need a second dose. Pneumococcal conjugate (PCV13) vaccine  You may need this if you have certain conditions and were not previously vaccinated. Pneumococcal polysaccharide (PPSV23) vaccine  You may need one or two doses if you smoke cigarettes or if you have certain conditions. Meningococcal conjugate (MenACWY) vaccine  You may need this if you have certain conditions. Hepatitis A vaccine   You may need this if you have certain conditions or if you travel or work in places where you may be exposed to hepatitis A. Hepatitis B vaccine  You may need this if you have certain conditions or if you travel or work in places where you may be exposed to hepatitis B. Haemophilus influenzae type b (Hib) vaccine  You may need this if you have certain risk factors. Human papillomavirus (HPV) vaccine  If recommended by your health care provider, you may need three doses over 6 months. You may receive vaccines as individual doses or as more than one vaccine together in one shot (combination vaccines). Talk with your health care provider about the risks and benefits of combination vaccines. What tests do I need? Blood tests  Lipid and cholesterol levels. These may be checked every 5 years, or more frequently if you are over 50 years old.  Hepatitis C test.  Hepatitis B test. Screening  Lung cancer screening. You may have this screening every year starting at age 55 if you have a 30-pack-year history of smoking and currently smoke or have quit within the past 15 years.  Prostate cancer screening. Recommendations will vary depending on your family history and other risks.  Colorectal cancer screening. All adults should have this screening starting at age 50 and continuing until age 75. Your health care provider may recommend screening at age 45 if you are at increased risk. You will have tests every 1-10 years, depending on your results and the type of screening test.  Diabetes screening. This is done by checking your blood sugar (glucose) after you have not eaten   for a while (fasting). You may have this done every 1-3 years.  Sexually transmitted disease (STD) testing. Follow these instructions at home: Eating and drinking  Eat a diet that includes fresh fruits and vegetables, whole grains, lean protein, and low-fat dairy products.  Take vitamin and mineral supplements as recommended  by your health care provider.  Do not drink alcohol if your health care provider tells you not to drink.  If you drink alcohol: ? Limit how much you have to 0-2 drinks a day. ? Be aware of how much alcohol is in your drink. In the [place] one drink equals one 12 oz bottle of beer (355 mL), one 5 oz glass of wine (148 mL), or one 1 oz glass of hard liquor (44 mL). Lifestyle  Take daily care of your teeth and gums.  Stay active. Exercise for at least 30 minutes on 5 or more days each week.  Do not use any products that contain nicotine or tobacco, such as cigarettes, e-cigarettes, and chewing tobacco. If you need help quitting, ask your health care provider.  If you are sexually active, practice safe sex. Use a condom or other form of protection to prevent STIs (sexually transmitted infections).  Talk with your health care provider about taking a low-dose aspirin every day starting at age 55. What's next?  Go to your health care provider once a year for a well check visit.  Ask your health care provider how often you should have your eyes and teeth checked.  Stay up to date on all vaccines. This information is not intended to replace advice given to you by your health care provider. Make sure you discuss any questions you have with your health care provider. Document Revised: 01/25/2018 Document Reviewed: 01/25/2018 Elsevier Patient Education  2020 [employer].   See hand out on PSA screening lab  Prostate Cancer  The prostate is a male gland that helps make semen. Prostate cancer is when abnormal cells grow in this gland. Follow these instructions at home:  Take over-the-counter and prescription medicines only as told by your doctor.  Eat a healthy diet.  Get plenty of sleep.  Ask your doctor for help to find a support group for men with prostate cancer.  Keep all follow-up visits as told by your doctor. This is important.  If you have to go to the hospital, let your  cancer doctor (oncologist) know.  Touch, hold, hug, and caress your partner to continue to show sexual feelings. Contact a doctor if:  You have trouble peeing (urinating).  You have blood in your pee (urine).  You have pain in your hips, back, or chest. Get help right away if:  You have weakness in your legs.  You lose feeling (have numbness) in your legs.  You cannot control your pee or your poop (stool).  You have trouble breathing.  You have sudden pain in your chest.  You have chills or a fever. Summary  The prostate is a male gland that helps make semen. Prostate cancer is when abnormal cells grow in this gland.  Ask your doctor for help to find a support group for men with prostate cancer.  Contact a doctor if you have problems peeing or have any new pain that you did not have before. This information is not intended to replace advice given to you by your health care provider. Make sure you discuss any questions you have with your health care provider. Document Revised: 01/13/2017 Document Reviewed:  10/12/2015 Elsevier Patient Education  [employer].

## 2019-04-13 LAB — CBC WITH DIFFERENTIAL/PLATELET
Absolute Monocytes: 874 cells/uL (ref 200–950)
Basophils Absolute: 42 cells/uL (ref 0–200)
Basophils Relative: 0.5 %
Eosinophils Absolute: 168 cells/uL (ref 15–500)
Eosinophils Relative: 2 %
HCT: 43.2 % (ref 38.5–50.0)
Hemoglobin: 15.1 g/dL (ref 13.2–17.1)
Lymphs Abs: 3318 cells/uL (ref 850–3900)
MCH: 30.5 pg (ref 27.0–33.0)
MCHC: 35 g/dL (ref 32.0–36.0)
MCV: 87.3 fL (ref 80.0–100.0)
MPV: 12.5 fL (ref 7.5–12.5)
Monocytes Relative: 10.4 %
Neutro Abs: 3998 cells/uL (ref 1500–7800)
Neutrophils Relative %: 47.6 %
Platelets: 232 10*3/uL (ref 140–400)
RBC: 4.95 10*6/uL (ref 4.20–5.80)
RDW: 13.3 % (ref 11.0–15.0)
Total Lymphocyte: 39.5 %
WBC: 8.4 10*3/uL (ref 3.8–10.8)

## 2019-04-13 LAB — COMPLETE METABOLIC PANEL WITH GFR
AG Ratio: 1.5 (calc) (ref 1.0–2.5)
ALT: 24 U/L (ref 9–46)
AST: 22 U/L (ref 10–40)
Albumin: 4.4 g/dL (ref 3.6–5.1)
Alkaline phosphatase (APISO): 70 U/L (ref 36–130)
BUN: 9 mg/dL (ref 7–25)
CO2: 28 mmol/L (ref 20–32)
Calcium: 9.6 mg/dL (ref 8.6–10.3)
Chloride: 103 mmol/L (ref 98–110)
Creat: 1.14 mg/dL (ref 0.60–1.35)
GFR, Est African American: 92 mL/min/{1.73_m2} (ref >=60)
GFR, Est Non African American: 79 mL/min/{1.73_m2} (ref >=60)
Globulin: 2.9 g/dL (calc) (ref 1.9–3.7)
Glucose, Bld: 72 mg/dL (ref 65–99)
Potassium: 4.1 mmol/L (ref 3.5–5.3)
Sodium: 139 mmol/L (ref 135–146)
Total Bilirubin: 0.4 mg/dL (ref 0.2–1.2)
Total Protein: 7.3 g/dL (ref 6.1–8.1)

## 2019-04-13 LAB — LIPID PANEL
Cholesterol: 222 mg/dL — ABNORMAL HIGH (ref <200)
HDL: 32 mg/dL — ABNORMAL LOW (ref >=40)
LDL Cholesterol (Calc): 151 mg/dL (calc) — ABNORMAL HIGH
Non-HDL Cholesterol (Calc): 190 mg/dL (calc) — ABNORMAL HIGH (ref <130)
Total CHOL/HDL Ratio: 6.9 (calc) — ABNORMAL HIGH (ref <5.0)
Triglycerides: 230 mg/dL — ABNORMAL HIGH (ref <150)

## 2019-04-13 LAB — HEMOGLOBIN A1C
Hgb A1c MFr Bld: 5.7 % of total Hgb — ABNORMAL HIGH (ref <5.7)
Mean Plasma Glucose: 117 (calc)
eAG (mmol/L): 6.5 (calc)

## 2019-04-13 LAB — TSH: TSH: 3.24 mIU/L (ref 0.40–4.50)

## 2019-04-13 LAB — T4, FREE: Free T4: 0.9 ng/dL (ref 0.8–1.8)

## 2019-10-10 ENCOUNTER — Ambulatory Visit: Payer: 59 | Admitting: Family Medicine

## 2020-04-14 ENCOUNTER — Ambulatory Visit: Payer: Self-pay | Admitting: Family Medicine

## 2020-05-19 ENCOUNTER — Other Ambulatory Visit: Payer: Self-pay

## 2020-05-19 ENCOUNTER — Ambulatory Visit (INDEPENDENT_AMBULATORY_CARE_PROVIDER_SITE_OTHER): Payer: 59 | Admitting: Family Medicine

## 2020-05-19 ENCOUNTER — Encounter: Payer: Self-pay | Admitting: Family Medicine

## 2020-05-19 VITALS — BP 118/74 | HR 74 | Temp 97.9°F | Resp 18 | Ht 68.0 in | Wt 242.8 lb

## 2020-05-19 DIAGNOSIS — K219 Gastro-esophageal reflux disease without esophagitis: Secondary | ICD-10-CM

## 2020-05-19 DIAGNOSIS — Z130 Encounter for screening for diseases of the blood and blood-forming organs and certain disorders involving the immune mechanism: Secondary | ICD-10-CM

## 2020-05-19 DIAGNOSIS — E6609 Other obesity due to excess calories: Secondary | ICD-10-CM

## 2020-05-19 DIAGNOSIS — E785 Hyperlipidemia, unspecified: Secondary | ICD-10-CM

## 2020-05-19 DIAGNOSIS — Z6835 Body mass index (BMI) 35.0-35.9, adult: Secondary | ICD-10-CM | POA: Insufficient documentation

## 2020-05-19 DIAGNOSIS — R7303 Prediabetes: Secondary | ICD-10-CM

## 2020-05-19 DIAGNOSIS — R7989 Other specified abnormal findings of blood chemistry: Secondary | ICD-10-CM | POA: Diagnosis not present

## 2020-05-19 DIAGNOSIS — Z6836 Body mass index (BMI) 36.0-36.9, adult: Secondary | ICD-10-CM

## 2020-05-19 DIAGNOSIS — Z13220 Encounter for screening for lipoid disorders: Secondary | ICD-10-CM

## 2020-05-19 DIAGNOSIS — E66812 Obesity, class 2: Secondary | ICD-10-CM | POA: Insufficient documentation

## 2020-05-19 DIAGNOSIS — Z8042 Family history of malignant neoplasm of prostate: Secondary | ICD-10-CM

## 2020-05-19 DIAGNOSIS — Z0000 Encounter for general adult medical examination without abnormal findings: Secondary | ICD-10-CM

## 2020-05-19 MED ORDER — FAMOTIDINE 20 MG PO TABS: 20.0000 mg | ORAL_TABLET | Freq: Two times a day (BID) | ORAL | 2 refills | Status: DC | PRN

## 2020-05-19 NOTE — Progress Notes (Signed)
Patient: Jose Lowe, Male    DOB: 10/08/1980, 40 y.o.   MRN: [id number] Danelle Berry, PA-C Visit Date: 05/19/2020  Today's Provider: Danelle Berry, PA-C   Chief Complaint  Patient presents with  . Annual Exam   Subjective:   Annual physical exam:  Jose Sease is a 40 y.o. male who presents today for health maintenance and annual & complete physical exam.   Exercise/Activity:  A lot of walking with his job, working on a challenge with his wife, was at 250 and now 48 Diet/nutrition:   Cut back on fried foods Sleep:  No concerns - switched from third to first shift  HLD - not currently on meds - instructed previously by PCP Lada and previously by myself to work on diet/lifestyle weight loss  GERD-  Improved symptoms after detox and changing diet -   Prediabetes - new dx Lab Results  Component Value Date   HGBA1C 5.7 (H) 04/12/2019   Obesity - worsening - weight increasing since last year, but his high recently was 250 Wt Readings from Last 5 Encounters:  05/19/20 242 lb 12.8 oz (110.1 kg)  04/12/19 233 lb 14.4 oz (106.1 kg)  02/28/18 233 lb 12.8 oz (106.1 kg)  10/02/17 225 lb (102.1 kg)  12/19/16 215 lb 8 oz (97.8 kg)   BMI Readings from Last 5 Encounters:  05/19/20 36.92 kg/m  04/12/19 35.56 kg/m  02/28/18 35.55 kg/m  10/02/17 34.21 kg/m  12/19/16 32.53 kg/m    USPSTF grade A and B recommendations - reviewed and addressed today  Depression:   Phq 9 completed today by patient, was reviewed by me with patient in the room, score is  negative, pt feels good PHQ 2/9 Scores 05/19/2020 04/12/2019 02/28/2018 12/19/2016  PHQ - 2 Score 0 0 0 0  PHQ- 9 Score 0 0 0 -   Depression screen [facility] 2/9 05/19/2020 04/12/2019 02/28/2018 12/19/2016 12/10/2015  Decreased Interest 0 0 0 0 0  Down, Depressed, Hopeless 0 0 0 0 0  PHQ - 2 Score 0 0 0 0 0  Altered sleeping 0 0 0 - -  Tired, decreased energy 0 0 0 - -  Change in appetite 0 0 0 - -  Feeling bad or failure about yourself  0 0 0  - -  Trouble concentrating 0 0 0 - -  Moving slowly or fidgety/restless 0 0 0 - -  Suicidal thoughts 0 0 0 - -  PHQ-9 Score 0 0 0 - -  Difficult doing work/chores Not difficult at all Not difficult at all Not difficult at all - -    Hep C Screening: done STD testing and prevention (HIV/chl/gon/syphilis): HIV no std testing desired or needed Intimate partner violence:  safe  Prostate cancer:   Prostate cancer screening with PSA: Discussed risks and benefits of PSA testing and provided handout. Pt  to have PSA drawn today.   Advanced Care Planning:  A voluntary discussion about advance care planning including the explanation and discussion of advance directives.  Discussed health care proxy and Living will, and the patient was able to identify a health care proxy as his wife.  Patient does not have a living will at present time. If patient does have living will, I have requested they bring this to the clinic to be scanned in to their chart.  Health Maintenance  Topic Date Due  . COVID-19 Vaccine (3 - Booster for Pfizer series) 11/30/2019  . TETANUS/TDAP  09/08/2020  . INFLUENZA  VACCINE  09/14/2020  . Hepatitis C Screening  Completed  . HIV Screening  Completed  . HPV VACCINES  Aged Out     Skin cancer:  .  Pt reports denies hx of skin cancer, suspicious lesions/biopsies in the past.  Discussed atypical lesions   Colorectal cancer:  colonoscopy is not due per age   Pt denies melena, hematochezia, change in bowels  Lung cancer:   Low Dose CT Chest recommended if Age 42-80 years, 20 pack-year currently smoking OR have quit w/in 15years. Patient does not qualify.   Social History   Tobacco Use  . Smoking status: Never Smoker  . Smokeless tobacco: Never Used  Substance Use Topics  . Alcohol use: Yes    Comment: occasionally     Alcohol screening: Flowsheet Row Office Visit from 04/12/2019 in [facility]  AUDIT-C Score 0      AAA: n/a The USPSTF  recommends one-time screening with ultrasonography in men ages 49 to 54 years who have ever smoked  ECG:n/a or indicated today  Blood pressure/Hypertension: BP Readings from Last 3 Encounters:  05/19/20 118/74  04/12/19 122/72  02/28/18 124/82   Weight/Obesity: Wt Readings from Last 3 Encounters:  05/19/20 242 lb 12.8 oz (110.1 kg)  04/12/19 233 lb 14.4 oz (106.1 kg)  02/28/18 233 lb 12.8 oz (106.1 kg)   BMI Readings from Last 3 Encounters:  05/19/20 36.92 kg/m  04/12/19 35.56 kg/m  02/28/18 35.55 kg/m    Lipids:  Lab Results  Component Value Date   CHOL 222 (H) 04/12/2019   CHOL 215 (H) 02/28/2018   CHOL 212 (H) 12/19/2016   Lab Results  Component Value Date   HDL 32 (L) 04/12/2019   HDL 37 (L) 02/28/2018   HDL 39 (L) 12/19/2016   Lab Results  Component Value Date   LDLCALC 151 (H) 04/12/2019   LDLCALC 154 (H) 02/28/2018   LDLCALC 145 (H) 12/19/2016   Lab Results  Component Value Date   TRIG 230 (H) 04/12/2019   TRIG 121 02/28/2018   TRIG 153 (H) 12/19/2016   Lab Results  Component Value Date   CHOLHDL 6.9 (H) 04/12/2019   CHOLHDL 5.8 (H) 02/28/2018   CHOLHDL 5.4 (H) 12/19/2016   No results found for: LDLDIRECT Based on the results of lipid panel his/her cardiovascular risk factor ( using Po[place] Cohort )  in the next 10 years is : The 10-year ASCVD risk score Denman George DC Montez Hageman., et al., 2013) is: 3.6%   Values used to calculate the score:     Age: 46 years     Sex: Male     Is Non-Hispanic African American: Yes     Diabetic: No     Tobacco smoker: No     Systolic Blood Pressure: 118 mmHg     Is BP treated: No     HDL Cholesterol: 32 mg/dL     Total Cholesterol: 222 mg/dL Glucose:  Glucose, Bld  Date Value Ref Range Status  04/12/2019 72 65 - 99 mg/dL Final    Comment:    .            Fasting reference interval .   02/28/2018 82 65 - 99 mg/dL Final    Comment:    .            Fasting reference interval .   12/19/2016 91 65 - 99 mg/dL Final     Comment:    .  Fasting reference interval .     Social History      He  reports that he has never smoked. He has never used smokeless tobacco. He reports current alcohol use. He reports that he does not use drugs.       Social History   Socioeconomic History  . Marital status: Married    Spouse name: Dashel Goines  . Number of children: 2  . Years of education: 32  . Highest education level: High school graduate  Occupational History  . Occupation: Retail buyer: [employer]  Tobacco Use  . Smoking status: Never Smoker  . Smokeless tobacco: Never Used  Vaping Use  . Vaping Use: Never used  Substance and Sexual Activity  . Alcohol use: Yes    Comment: occasionally  . Drug use: No  . Sexual activity: Yes    Partners: Female  Other Topics Concern  . Not on file  Social History Narrative  . Not on file   Social Determinants of Health   Financial Resource Strain: Not on file  Food Insecurity: Not on file  Transportation Needs: Not on file  Physical Activity: Not on file  Stress: Not on file  Social Connections: Not on file     Family History        Family Status  Relation Name Status  . Mother  Alive  . Father  Alive  . Mat [employer]  . MGM  Deceased  . PGM  Deceased  . PGF  Deceased  . Brother  Alive  . Daughter  Alive  . Son  Alive  . Neg Hx  (Not Specified)        His family history includes Asthma in his daughter; COPD in his paternal grandfather and paternal grandmother; Cancer in his maternal grandmother, maternal uncle, paternal grandfather, and paternal grandmother; Cancer (age of onset: 55) in his father; Diabetes in his maternal grandmother; Hyperlipidemia in his mother; Hypertension in his father and mother; Schizophrenia in his brother; Stroke in his maternal grandmother. There is no history of Heart disease.       Family History  Problem Relation Age of Onset  . Hypertension Mother   . Hyperlipidemia Mother   .  Hypertension Father   . Cancer Father 54       prostate  . Cancer Maternal Uncle        prostate  . Cancer Maternal Grandmother        breast  . Diabetes Maternal Grandmother   . Stroke Maternal Grandmother   . COPD Paternal Grandmother   . Cancer Paternal Grandmother        Lung Cancer  . COPD Paternal Grandfather   . Cancer Paternal Grandfather        Lung Cancer  . Schizophrenia Brother   . Asthma Daughter        Mild  . Heart disease Neg Hx     Patient Active Problem List   Diagnosis Date Noted  . Family history of prostate cancer in father 04/12/2019  . Elevated TSH 04/12/2019  . Hyperlipidemia 04/12/2019  . Acid reflux 02/28/2018  . 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 Preventative health care 11/10/2014  . Obesity 11/10/2014    Past Surgical History:  Procedure Laterality Date  . ANKLE SURGERY Right 1996  . VASECTOMY  2015    No current outpatient medications on file.  Allergies  Allergen Reactions  . Latex Itching and Rash  Patient Care Team: Danelle Berry, PA-C as PCP - General (Family Medicine) Sherie Don Janit Bern, MD as Attending Physician (Family Medicine)   Chart Review: I personally reviewed active problem list, medication list, allergies, family history, social history, health maintenance, notes from last encounter, lab results, imaging with the patient/caregiver today.   Review of Systems  Constitutional: Negative.   HENT: Negative.   Eyes: Negative.   Respiratory: Negative.   Cardiovascular: Negative.   Gastrointestinal: Negative.   Endocrine: Negative.   Genitourinary: Negative.   Musculoskeletal: Negative.   Skin: Negative.   Allergic/Immunologic: Negative.   Neurological: Negative.   Hematological: Negative.   Psychiatric/Behavioral: Negative.   All other systems reviewed and are negative.         Objective:   Vitals:  Vitals:   05/19/20 1456  BP: 118/74  Pulse: 74  Resp: 18  Temp: 97.9 F (36.6 C)  SpO2: 98%  Weight:  242 lb 12.8 oz (110.1 kg)  Height: 5\' 8"  (1.727 m)    Body mass index is 36.92 kg/m.  Physical Exam Vitals and nursing note reviewed.  Constitutional:      General: He is not in acute distress.    Appearance: Normal appearance. He is well-developed. He is not ill-appearing, toxic-appearing or diaphoretic.     Interventions: Face mask in place.  HENT:     Head: Normocephalic and atraumatic.     Jaw: No trismus.     Right Ear: Tympanic membrane, ear canal and external ear normal.     Left Ear: Tympanic membrane, ear canal and external ear normal.     Nose: No mucosal edema or rhinorrhea.     Right Sinus: No maxillary sinus tenderness or frontal sinus tenderness.     Left Sinus: No maxillary sinus tenderness or frontal sinus tenderness.     Mouth/Throat:     Pharynx: Uvula midline. No oropharyngeal exudate, posterior oropharyngeal erythema or uvula swelling.  Eyes:     General: Lids are normal. No scleral icterus.       Right eye: No discharge.        Left eye: No discharge.     Conjunctiva/sclera: Conjunctivae normal.     Pupils: Pupils are equal, round, and reactive to light.  Neck:     Trachea: Trachea and phonation normal. No tracheal deviation.  Cardiovascular:     Rate and Rhythm: Normal rate and regular rhythm.     Pulses: Normal pulses.          Radial pulses are 2+ on the right side and 2+ on the left side.       Posterior tibial pulses are 2+ on the right side and 2+ on the left side.     Heart sounds: Normal heart sounds. No murmur heard. No friction rub. No gallop.   Pulmonary:     Effort: Pulmonary effort is normal. No respiratory distress.     Breath sounds: Normal breath sounds. No stridor. No wheezing, rhonchi or rales.  Abdominal:     General: Bowel sounds are normal. There is no distension.     Palpations: Abdomen is soft.     Tenderness: There is no abdominal tenderness. There is no guarding or rebound.  Musculoskeletal:        General: Normal range of  motion.     Cervical back: Normal range of motion and neck supple.     Right lower leg: No edema.     Left lower leg: No edema.  Skin:  General: Skin is warm and dry.     Capillary Refill: Capillary refill takes less than 2 seconds.     Coloration: Skin is not jaundiced.     Findings: No rash.     Nails: There is no clubbing.  Neurological:     Mental Status: He is alert and oriented to person, place, and time. Mental status is at baseline.     Cranial Nerves: No dysarthria or facial asymmetry.     Motor: No tremor or abnormal muscle tone.     Gait: Gait normal.  Psychiatric:        Mood and Affect: Mood normal.        Speech: Speech normal.        Behavior: Behavior normal. Behavior is cooperative.      No results found for this or any previous visit (from the past 2160 hour(s)).     Fall Risk: Fall Risk  05/19/2020 04/12/2019 02/28/2018 12/19/2016 12/10/2015  Falls in the past year? 0 0 0 No No  Number falls in past yr: 0 0 0 - -  Injury with Fall? 0 0 0 - -    Functional Status Survey: Is the patient deaf or have difficulty hearing?: No Does the patient have difficulty seeing, even when wearing glasses/contacts?: No Does the patient have difficulty concentrating, remembering, or making decisions?: No Does the patient have difficulty walking or climbing stairs?: No Does the patient have difficulty dressing or bathing?: No Does the patient have difficulty doing errands alone such as visiting a doctor's office or shopping?: No   Assessment & Plan:    CPE completed today  . Prostate cancer screening and PSA options (with potential risks and benefits of testing vs not testing) were discussed along with recent recs/guidelines, shared decision making and handout/information given to pt today  . USPSTF grade A and B recommendations reviewed with patient; age-appropriate recommendations, preventive care, screening tests, etc discussed and encouraged; healthy living  encouraged; see AVS for patient education given to patient  . Discussed importance of 150 minutes of physical activity weekly, AHA exercise recommendations given to pt in AVS/handout  . Discussed importance of healthy diet:  eating lean meats and proteins, avoiding trans fats and saturated fats, avoid simple sugars and excessive carbs in diet, eat 6 servings of fruit/vegetables daily and drink plenty of water and avoid sweet beverages.  DASH diet reviewed if pt has HTN  . Recommended pt to do annual eye exam and routine dental exams/cleanings  . Reviewed Health Maintenance: Health Maintenance  Topic Date Due  . COVID-19 Vaccine (3 - Booster for Pfizer series) 11/30/2019  . TETANUS/TDAP  09/08/2020  . INFLUENZA VACCINE  09/14/2020  . Hepatitis C Screening  Completed  . HIV Screening  Completed  . HPV VACCINES  Aged Out    . Immunizations: Immunization History  Administered Date(s) Administered  . Influenza,inj,Quad PF,6+ Mos 12/10/2015, 02/28/2018, 11/21/2018  . Influenza-Unspecified 11/08/2013, 10/16/2014, 11/21/2018  . PFIZER(Purple Top)SARS-COV-2 Vaccination 04/30/2019, 05/31/2019  . Td 09/09/2010     ICD-10-CM   1. Adult general medical exam  Z00.00 Lipid panel    COMPLETE METABOLIC PANEL WITH GFR    CBC w/Diff/Platelet    Hemoglobin A1c    CANCELED: PSA  2. Class 2 obesity due to excess calories without serious comorbidity with body mass index (BMI) of 36.0 to 36.9 in adult  E66.09 Lipid panel   Z68.36 COMPLETE METABOLIC PANEL WITH GFR    CBC w/Diff/Platelet  TSH    Hemoglobin A1c    T4, free   BMI and weight increasing gradually, concern for development of comorbidities, discussed diet/lifestyle, encouraged f/up or referrals   3. Elevated TSH  R79.89 TSH    T4, free   weight increased, recheck TSH and t4  4. Hyperlipidemia, unspecified hyperlipidemia type  E78.5 Lipid panel    COMPLETE METABOLIC PANEL WITH GFR   elevated but ASCVD risk <7.5% and pt not on meds,  urged to work on diet/lifestyle  5. Gastroesophageal reflux disease, unspecified whether esophagitis present  K21.9    improved with some diet changes and "cleanse"  6. Prediabetes  R73.03 COMPLETE METABOLIC PANEL WITH GFR    Hemoglobin A1c   new as of last year, with increasing weight/bmi recheck A1C       Danelle Berry, PA-C 05/19/20 3:18 PM  [facility]

## 2020-05-20 LAB — CBC WITH DIFFERENTIAL/PLATELET
Absolute Monocytes: 1056 cells/uL — ABNORMAL HIGH (ref 200–950)
Basophils Absolute: 58 cells/uL (ref 0–200)
Basophils Relative: 0.6 %
Eosinophils Absolute: 163 cells/uL (ref 15–500)
Eosinophils Relative: 1.7 %
HCT: 46.4 % (ref 38.5–50.0)
Hemoglobin: 15.9 g/dL (ref 13.2–17.1)
Lymphs Abs: 3888 cells/uL (ref 850–3900)
MCH: 29.9 pg (ref 27.0–33.0)
MCHC: 34.3 g/dL (ref 32.0–36.0)
MCV: 87.2 fL (ref 80.0–100.0)
MPV: 12.7 fL — ABNORMAL HIGH (ref 7.5–12.5)
Monocytes Relative: 11 %
Neutro Abs: 4435 cells/uL (ref 1500–7800)
Neutrophils Relative %: 46.2 %
Platelets: 263 10*3/uL (ref 140–400)
RBC: 5.32 10*6/uL (ref 4.20–5.80)
RDW: 12.9 % (ref 11.0–15.0)
Total Lymphocyte: 40.5 %
WBC: 9.6 10*3/uL (ref 3.8–10.8)

## 2020-05-20 LAB — COMPLETE METABOLIC PANEL WITH GFR
AG Ratio: 1.6 (calc) (ref 1.0–2.5)
ALT: 29 U/L (ref 9–46)
AST: 25 U/L (ref 10–40)
Albumin: 4.6 g/dL (ref 3.6–5.1)
Alkaline phosphatase (APISO): 87 U/L (ref 36–130)
BUN: 7 mg/dL (ref 7–25)
CO2: 28 mmol/L (ref 20–32)
Calcium: 9.8 mg/dL (ref 8.6–10.3)
Chloride: 102 mmol/L (ref 98–110)
Creat: 1.14 mg/dL (ref 0.60–1.35)
GFR, Est African American: 91 mL/min/{1.73_m2} (ref >=60)
GFR, Est Non African American: 79 mL/min/{1.73_m2} (ref >=60)
Globulin: 2.9 g/dL (calc) (ref 1.9–3.7)
Glucose, Bld: 84 mg/dL (ref 65–99)
Potassium: 4.1 mmol/L (ref 3.5–5.3)
Sodium: 138 mmol/L (ref 135–146)
Total Bilirubin: 0.5 mg/dL (ref 0.2–1.2)
Total Protein: 7.5 g/dL (ref 6.1–8.1)

## 2020-05-20 LAB — HEMOGLOBIN A1C
Hgb A1c MFr Bld: 5.9 % of total Hgb — ABNORMAL HIGH (ref <5.7)
Mean Plasma Glucose: 123 mg/dL
eAG (mmol/L): 6.8 mmol/L

## 2020-05-20 LAB — LIPID PANEL
Cholesterol: 220 mg/dL — ABNORMAL HIGH (ref <200)
HDL: 35 mg/dL — ABNORMAL LOW (ref >=40)
LDL Cholesterol (Calc): 146 mg/dL (calc) — ABNORMAL HIGH
Non-HDL Cholesterol (Calc): 185 mg/dL (calc) — ABNORMAL HIGH (ref <130)
Total CHOL/HDL Ratio: 6.3 (calc) — ABNORMAL HIGH (ref <5.0)
Triglycerides: 252 mg/dL — ABNORMAL HIGH (ref <150)

## 2020-05-20 LAB — TSH: TSH: 5.28 mIU/L — ABNORMAL HIGH (ref 0.40–4.50)

## 2020-05-20 LAB — T4, FREE: Free T4: 0.9 ng/dL (ref 0.8–1.8)

## 2020-05-21 NOTE — Patient Instructions (Signed)
Health Maintenance  Topic Date Due  . COVID-19 Vaccine (3 - Booster for Pfizer series) 11/30/2019  . Tetanus Vaccine  09/08/2020  . Flu Shot  09/14/2020  .  Hepatitis C: One time screening is recommended by Center for Disease Control  (CDC) for  adults born from [birth year] through 1967.   Completed  . HIV Screening  Completed  . HPV Vaccine  Aged Out   Lab Results  Component Value Date   CHOL 220 (H) 05/19/2020   HDL 35 (L) 05/19/2020   LDLCALC 146 (H) 05/19/2020   TRIG 252 (H) 05/19/2020   CHOLHDL 6.3 (H) 05/19/2020   The 10-year ASCVD risk score Denman George DC Jr., et al., 2013) is: 3.5%   Values used to calculate the score:     Age: 40 years     Sex: Male     Is Non-Hispanic African American: Yes     Diabetic: No     Tobacco smoker: No     Systolic Blood Pressure: 118 mmHg     Is BP treated: No     HDL Cholesterol: 35 mg/dL     Total Cholesterol: 220 mg/dL   Wt Readings from Last 5 Encounters:  05/19/20 242 lb 12.8 oz (110.1 kg)  04/12/19 233 lb 14.4 oz (106.1 kg)  02/28/18 233 lb 12.8 oz (106.1 kg)  10/02/17 225 lb (102.1 kg)  12/19/16 215 lb 8 oz (97.8 kg)   BMI Readings from Last 5 Encounters:  05/19/20 36.92 kg/m  04/12/19 35.56 kg/m  02/28/18 35.55 kg/m  10/02/17 34.21 kg/m  12/19/16 32.53 kg/m     Preventive Care 29-18 Years Old, Male Preventive care refers to lifestyle choices and visits with your health care provider that can promote health and wellness. This includes:  A yearly physical exam. This is also called an annual wellness visit.  Regular dental and eye exams.  Immunizations.  Screening for certain conditions.  Healthy lifestyle choices, such as: ? Eating a healthy diet. ? Getting regular exercise. ? Not using drugs or products that contain nicotine and tobacco. ? Limiting alcohol use. What can I expect for my preventive care visit? Physical exam Your health care provider will check your:  Height and weight. These may be used to calculate  your BMI (body mass index). BMI is a measurement that tells if you are at a healthy weight.  Heart rate and blood pressure.  Body temperature.  Skin for abnormal spots. Counseling Your health care provider may ask you questions about your:  Past medical problems.  Family's medical history.  Alcohol, tobacco, and drug use.  Emotional well-being.  Home life and relationship well-being.  Sexual activity.  Diet, exercise, and sleep habits.  Work and work Astronomer.  Access to firearms. What immunizations do I need? Vaccines are usually given at various ages, according to a schedule. Your health care provider will recommend vaccines for you based on your age, medical history, and lifestyle or other factors, such as travel or where you work.   What tests do I need? Blood tests  Lipid and cholesterol levels. These may be checked every 5 years, or more often if you are over 46 years old.  Hepatitis C test.  Hepatitis B test. Screening  Lung cancer screening. You may have this screening every year starting at age 14 if you have a 30-pack-year history of smoking and currently smoke or have quit within the past 15 years.  Prostate cancer screening. Recommendations will vary depending on your  family history and other risks.  Genital exam to check for testicular cancer or hernias.  Colorectal cancer screening. ? All adults should have this screening starting at age 79 and continuing until age 16. ? Your health care provider may recommend screening at age 60 if you are at increased risk. ? You will have tests every 1-10 years, depending on your results and the type of screening test.  Diabetes screening. ? This is done by checking your blood sugar (glucose) after you have not eaten for a while (fasting). ? You may have this done every 1-3 years.  STD (sexually transmitted disease) testing, if you are at risk. Follow these instructions at home: Eating and drinking  Eat a  diet that includes fresh fruits and vegetables, whole grains, lean protein, and low-fat dairy products.  Take vitamin and mineral supplements as recommended by your health care provider.  Do not drink alcohol if your health care provider tells you not to drink.  If you drink alcohol: ? Limit how much you have to 0-2 drinks a day. ? Be aware of how much alcohol is in your drink. In the [place] one drink equals one 12 oz bottle of beer (355 mL), one 5 oz glass of wine (148 mL), or one 1 oz glass of hard liquor (44 mL).   Lifestyle  Take daily care of your teeth and gums. Brush your teeth every morning and night with fluoride toothpaste. Floss one time each day.  Stay active. Exercise for at least 30 minutes 5 or more days each week.  Do not use any products that contain nicotine or tobacco, such as cigarettes, e-cigarettes, and chewing tobacco. If you need help quitting, ask your health care provider.  Do not use drugs.  If you are sexually active, practice safe sex. Use a condom or other form of protection to prevent STIs (sexually transmitted infections).  If told by your health care provider, take low-dose aspirin daily starting at age 58.  Find healthy ways to cope with stress, such as: ? Meditation, yoga, or listening to music. ? Journaling. ? Talking to a trusted person. ? Spending time with friends and family. Safety  Always wear your seat belt while driving or riding in a vehicle.  Do not drive: ? If you have been drinking alcohol. Do not ride with someone who has been drinking. ? When you are tired or distracted. ? While texting.  Wear a helmet and other protective equipment during sports activities.  If you have firearms in your house, make sure you follow all gun safety procedures. What's next?  Go to your health care provider once a year for an annual wellness visit.  Ask your health care provider how often you should have your eyes and teeth checked.  Stay up  to date on all vaccines. This information is not intended to replace advice given to you by your health care provider. Make sure you discuss any questions you have with your health care provider. Document Revised: 10/30/2018 Document Reviewed: 01/25/2018 Elsevier Patient Education  2021 [employer].

## 2021-05-17 NOTE — Patient Instructions (Signed)

## 2021-05-20 ENCOUNTER — Encounter: Payer: Self-pay | Admitting: Family Medicine

## 2021-05-20 ENCOUNTER — Ambulatory Visit (INDEPENDENT_AMBULATORY_CARE_PROVIDER_SITE_OTHER): Payer: 59 | Admitting: Family Medicine

## 2021-05-20 VITALS — BP 114/78 | HR 72 | Temp 97.7°F | Resp 16 | Ht 68.0 in | Wt 246.1 lb

## 2021-05-20 DIAGNOSIS — E6609 Other obesity due to excess calories: Secondary | ICD-10-CM

## 2021-05-20 DIAGNOSIS — Z6837 Body mass index (BMI) 37.0-37.9, adult: Secondary | ICD-10-CM

## 2021-05-20 DIAGNOSIS — R7303 Prediabetes: Secondary | ICD-10-CM | POA: Diagnosis not present

## 2021-05-20 DIAGNOSIS — K219 Gastro-esophageal reflux disease without esophagitis: Secondary | ICD-10-CM

## 2021-05-20 DIAGNOSIS — Z8042 Family history of malignant neoplasm of prostate: Secondary | ICD-10-CM | POA: Diagnosis not present

## 2021-05-20 DIAGNOSIS — Z0000 Encounter for general adult medical examination without abnormal findings: Secondary | ICD-10-CM

## 2021-05-20 DIAGNOSIS — R7989 Other specified abnormal findings of blood chemistry: Secondary | ICD-10-CM

## 2021-05-20 DIAGNOSIS — E785 Hyperlipidemia, unspecified: Secondary | ICD-10-CM

## 2021-05-20 NOTE — Progress Notes (Signed)
? ? ?Patient: Jose Lowe, Male    DOB: 1980/10/08, 41 y.o.   MRN: [id number] ?Danelle Berry, PA-C ?Visit Date: 05/20/2021 ? ?Today's Provider: Danelle Berry, PA-C  ? ?Chief Complaint  ?Patient presents with  ? Annual Exam  ? ?Subjective:  ? ?Annual physical exam: ? ?Jose Gill is a 41 y.o. male who presents today for health maintenance and annual & complete physical exam.  ? ?Exercise/Activity:  really active walking 8000+ steps    ?Diet/nutrition:  no particular diet efforts ?Sleep:  sleeps good ? ?SDOH Screenings  ? ?Alcohol Screen: Low Risk   ? Last Alcohol Screening Score (AUDIT): 4  ?Depression (PHQ2-9): Low Risk   ? PHQ-2 Score: 0  ?Financial Resource Strain: Low Risk   ? Difficulty of Paying Living Expenses: Not hard at all  ?Food Insecurity: No Food Insecurity  ? Worried About Programme researcher, broadcasting/film/video in the Last Year: Never true  ? Ran Out of Food in the Last Year: Never true  ?Housing: Low Risk   ? Last Housing Risk Score: 0  ?Physical Activity: Insufficiently Active  ? Days of Exercise per Week: 3 days  ? Minutes of Exercise per Session: 30 min  ?Social Connections: Moderately Isolated  ? Frequency of Communication with Friends and Family: More than three times a week  ? Frequency of Social Gatherings with Friends and Family: Three times a week  ? Attends Religious Services: Never  ? Active Member of Clubs or Organizations: No  ? Attends Banker Meetings: Never  ? Marital Status: Married  ?Stress: Stress Concern Present  ? Feeling of Stress : To some extent  ?Tobacco Use: Low Risk   ? Smoking Tobacco Use: Never  ? Smokeless Tobacco Use: Never  ? Passive Exposure: Not on file  ?Transportation Needs: No Transportation Needs  ? Lack of Transportation (Medical): No  ? Lack of Transportation (Non-Medical): No  ? ? ?Pt wished  ?to do routine f/up on chronic conditions today in addition to CPE. ?Advised pt of separate visit billing/coding ? ?Multiple Dx that he does not otherwise come to office for management  of ? ?Hyperlipidemia: ?Not on meds ?Last Lipids: ?Lab Results  ?Component Value Date  ? CHOL 220 (H) 05/19/2020  ? HDL 35 (L) 05/19/2020  ? LDLCALC 146 (H) 05/19/2020  ? TRIG 252 (H) 05/19/2020  ? CHOLHDL 6.3 (H) 05/19/2020  ? ?- Denies: Chest pain, shortness of breath, myalgias, claudication ? ?GERD:  improved previously after diet changes ? ?Prediabetes ?Recent pertinent labs: ?Lab Results  ?Component Value Date  ? HGBA1C 5.9 (H) 05/19/2020  ? HGBA1C 5.7 (H) 04/12/2019  ? ? ?Obesity- BMI increased further ?Wt Readings from Last 5 Encounters:  ?05/20/21 246 lb 1.6 oz (111.6 kg)  ?05/19/20 242 lb 12.8 oz (110.1 kg)  ?04/12/19 233 lb 14.4 oz (106.1 kg)  ?02/28/18 233 lb 12.8 oz (106.1 kg)  ?10/02/17 225 lb (102.1 kg)  ? ?BMI Readings from Last 5 Encounters:  ?05/20/21 37.42 kg/m?  ?05/19/20 36.92 kg/m?  ?04/12/19 35.56 kg/m?  ?02/28/18 35.55 kg/m?  ?10/02/17 34.21 kg/m?  ? ? ? ? ? ? ?USPSTF grade A and B recommendations - reviewed and addressed today ? ?Depression:  ?Phq 9 completed today by patient, was reviewed by me with patient in the room, score is  negative, pt feels  ? ?  05/20/2021  ?  3:26 PM 05/19/2020  ?  2:59 PM 04/12/2019  ?  3:00 PM  ?Depression screen PHQ 2/9  ?Decreased  Interest 0 0 0  ?Down, Depressed, Hopeless 0 0 0  ?PHQ - 2 Score 0 0 0  ?Altered sleeping 0 0 0  ?Tired, decreased energy 0 0 0  ?Change in appetite 0 0 0  ?Feeling bad or failure about yourself  0 0 0  ?Trouble concentrating 0 0 0  ?Moving slowly or fidgety/restless 0 0 0  ?Suicidal thoughts 0 0 0  ?PHQ-9 Score 0 0 0  ?Difficult doing work/chores Not difficult at all Not difficult at all Not difficult at all  ? ? ?Hep C Screening: done ? ?STD testing and prevention (HIV/chl/gon/syphilis): previously done, no need for more screening, one partner ? ?Intimate partner violence: safe ? ?Advanced Care Planning:  ?A voluntary discussion about advance care planning including the explanation and discussion of advance directives.  Discussed health  care proxy and Living will, and the patient was able to identify a health care proxy as wife Joni Reining.  Patient does not have a living will at present time. If patient does have living will, I have requested they bring this to the clinic to be scanned in to their chart. ? ?Health Maintenance  ?Topic Date Due  ? COVID-19 Vaccine (3 - Booster for Pfizer series) 06/05/2021 (Originally 07/26/2019)  ? TETANUS/TDAP  05/21/2022 (Originally 09/08/2020)  ? INFLUENZA VACCINE  09/14/2021  ? Hepatitis C Screening  Completed  ? HIV Screening  Completed  ? HPV VACCINES  Aged Out  ? ? ?Skin cancer:   last skin survey was.  Pt reports no hx of skin cancer, suspicious lesions/biopsies in the past. ? ?Colorectal cancer:  colonoscopy is not due per age ?Pt denies bowel changes  ? ?Prostate cancer:  ?Prostate cancer screening with PSA: Discussed risks and benefits of PSA testing and provided handout. Pt declines to have PSA drawn today. ?No results found for: PSA ? ?Urinary Symptoms:   denies straining, weak urine stream, double voiding, frequency nocturia ? ? ?Lung cancer:   Low Dose CT Chest recommended if Age 20-80 years, 20 pack-year currently smoking OR have quit w/in 15years. Patient does not qualify.   ?Social History  ? ?Tobacco Use  ? Smoking status: Never  ? Smokeless tobacco: Never  ?Substance Use Topics  ? Alcohol use: Yes  ?  Alcohol/week: 4.0 standard drinks  ?  Types: 4 Cans of beer per week  ?  Comment: occasionally  ?  ? ?Alcohol screening: ?Flowsheet Row [facility]/20/2021 [facility]  ?AUDIT-C Score 4  ? ?  ? ? ?AAA:  The USPSTF recommends one-time screening with ultrasonography in men ages 32 to 70 years who have ever smoked ? ?ECG:not indicated ? ?Blood pressure/Hypertension: ?BP Readings from Last 3 Encounters:  ?05/20/21 114/78  ?05/19/20 118/74  ?04/12/19 122/72  ? ?Weight/Obesity: ?Wt Readings from Last 3 Encounters:  ?05/20/21 246 lb 1.6 oz (111.6 kg)  ?05/19/20 242 lb 12.8 oz (110.1  kg)  ?04/12/19 233 lb 14.4 oz (106.1 kg)  ? ?BMI Readings from Last 3 Encounters:  ?05/20/21 37.42 kg/m?  ?05/19/20 36.92 kg/m?  ?04/12/19 35.56 kg/m?  ?  ?Lipids:  ?Lab Results  ?Component Value Date  ? CHOL 220 (H) 05/19/2020  ? CHOL 222 (H) 04/12/2019  ? CHOL 215 (H) 02/28/2018  ? ?Lab Results  ?Component Value Date  ? HDL 35 (L) 05/19/2020  ? HDL 32 (L) 04/12/2019  ? HDL 37 (L) 02/28/2018  ? ?Lab Results  ?Component Value Date  ? LDLCALC 146 (H) 05/19/2020  ?  LDLCALC 151 (H) 04/12/2019  ? LDLCALC 154 (H) 02/28/2018  ? ?Lab Results  ?Component Value Date  ? TRIG 252 (H) 05/19/2020  ? TRIG 230 (H) 04/12/2019  ? TRIG 121 02/28/2018  ? ?Lab Results  ?Component Value Date  ? CHOLHDL 6.3 (H) 05/19/2020  ? CHOLHDL 6.9 (H) 04/12/2019  ? CHOLHDL 5.8 (H) 02/28/2018  ? ?No results found for: LDLDIRECT ?Based on the results of lipid panel his/her cardiovascular risk factor ( using [facility] )  in the next 10 years is : ?The 10-year ASCVD risk score (Arnett DK, et al., 2019) is: 3.4% ?  Values used to calculate the score: ?    Age: 56 years ?    Sex: Male ?    Is Non-Hispanic African American: Yes ?    Diabetic: No ?    Tobacco smoker: No ?    Systolic Blood Pressure: 114 mmHg ?    Is BP treated: No ?    HDL Cholesterol: 35 mg/dL ?    Total Cholesterol: 220 mg/dL ?Glucose:  ?Glucose, Bld  ?Date Value Ref Range Status  ?05/19/2020 84 65 - 99 mg/dL Final  ?  Comment:  ?  . ?           Fasting reference interval ?. ?  ?04/12/2019 72 65 - 99 mg/dL Final  ?  Comment:  ?  . ?           Fasting reference interval ?. ?  ?02/28/2018 82 65 - 99 mg/dL Final  ?  Comment:  ?  . ?           Fasting reference interval ?. ?  ? ? ?Social History ?      ?Social History  ? ?Socioeconomic History  ? Marital status: Married  ?  Spouse name: Fonnie JarvisRochelle Mehrer  ? Number of children: 2  ? Years of education: 5912  ? Highest education level: High school graduate  ?Occupational History  ? Occupation: ProofreaderCrew Leader  ?  Employer: [employer]  ?Tobacco Use  ?  Smoking status: Never  ? Smokeless tobacco: Never  ?Vaping Use  ? Vaping Use: Never used  ?Substance and Sexual Activity  ? Alcohol use: Yes  ?  Alcohol/week: 4.0 standard drinks  ?  Types: 4 Cans of

## 2021-05-21 LAB — CBC WITH DIFFERENTIAL/PLATELET
Absolute Monocytes: 739 cells/uL (ref 200–950)
Basophils Absolute: 42 cells/uL (ref 0–200)
Basophils Relative: 0.5 %
Eosinophils Absolute: 109 cells/uL (ref 15–500)
Eosinophils Relative: 1.3 %
HCT: 43.7 % (ref 38.5–50.0)
Hemoglobin: 15 g/dL (ref 13.2–17.1)
Lymphs Abs: 3049 cells/uL (ref 850–3900)
MCH: 30 pg (ref 27.0–33.0)
MCHC: 34.3 g/dL (ref 32.0–36.0)
MCV: 87.4 fL (ref 80.0–100.0)
MPV: 13 fL — ABNORMAL HIGH (ref 7.5–12.5)
Monocytes Relative: 8.8 %
Neutro Abs: 4460 cells/uL (ref 1500–7800)
Neutrophils Relative %: 53.1 %
Platelets: 228 10*3/uL (ref 140–400)
RBC: 5 10*6/uL (ref 4.20–5.80)
RDW: 13.2 % (ref 11.0–15.0)
Total Lymphocyte: 36.3 %
WBC: 8.4 10*3/uL (ref 3.8–10.8)

## 2021-05-21 LAB — LIPID PANEL
Cholesterol: 230 mg/dL — ABNORMAL HIGH (ref <200)
HDL: 36 mg/dL — ABNORMAL LOW (ref >=40)
LDL Cholesterol (Calc): 158 mg/dL (calc) — ABNORMAL HIGH
Non-HDL Cholesterol (Calc): 194 mg/dL (calc) — ABNORMAL HIGH (ref <130)
Total CHOL/HDL Ratio: 6.4 (calc) — ABNORMAL HIGH (ref <5.0)
Triglycerides: 199 mg/dL — ABNORMAL HIGH (ref <150)

## 2021-05-21 LAB — COMPLETE METABOLIC PANEL WITH GFR
AG Ratio: 1.6 (calc) (ref 1.0–2.5)
ALT: 29 U/L (ref 9–46)
AST: 24 U/L (ref 10–40)
Albumin: 4.7 g/dL (ref 3.6–5.1)
Alkaline phosphatase (APISO): 78 U/L (ref 36–130)
BUN: 8 mg/dL (ref 7–25)
CO2: 27 mmol/L (ref 20–32)
Calcium: 9.9 mg/dL (ref 8.6–10.3)
Chloride: 101 mmol/L (ref 98–110)
Creat: 1.18 mg/dL (ref 0.60–1.29)
Globulin: 3 g/dL (calc) (ref 1.9–3.7)
Glucose, Bld: 80 mg/dL (ref 65–99)
Potassium: 4 mmol/L (ref 3.5–5.3)
Sodium: 137 mmol/L (ref 135–146)
Total Bilirubin: 0.8 mg/dL (ref 0.2–1.2)
Total Protein: 7.7 g/dL (ref 6.1–8.1)
eGFR: 79 mL/min/{1.73_m2} (ref >=60)

## 2021-05-21 LAB — HEMOGLOBIN A1C
Hgb A1c MFr Bld: 5.9 % of total Hgb — ABNORMAL HIGH (ref <5.7)
Mean Plasma Glucose: 123 mg/dL
eAG (mmol/L): 6.8 mmol/L

## 2021-05-21 LAB — T4: T4, Total: 6.2 ug/dL (ref 4.9–10.5)

## 2021-05-21 LAB — TSH: TSH: 5 mIU/L — ABNORMAL HIGH (ref 0.40–4.50)

## 2021-07-17 ENCOUNTER — Other Ambulatory Visit: Payer: Self-pay | Admitting: Family Medicine

## 2021-07-19 NOTE — Telephone Encounter (Signed)
Requested medication (s) are due for refill today - expired Rx  Requested medication (s) are on the active medication list -yes  Future visit scheduled -yes  Last refill: 05/19/20 #60 2RF  Notes to clinic: Expired Rx  Requested Prescriptions  Pending Prescriptions Disp Refills   famotidine (PEPCID) 20 MG tablet [Pharmacy Med Name: FAMOTIDINE 20MG  TABLETS] 60 tablet 2    Sig: TAKE 1 TABLET(20 MG) BY MOUTH TWICE DAILY AS NEEDED FOR HEARTBURN OR INDIGESTION     Gastroenterology:  H2 Antagonists Passed - 07/17/2021  3:32 AM      Passed - Valid encounter within last 12 months    Recent Outpatient Visits           2 months ago Annual physical exam   [facility] Delsa Grana, PA-C   1 year ago Adult general medical exam   [facility] Delsa Grana, PA-C   2 years ago Adult general medical exam   [facility] Delsa Grana, PA-C   3 years ago Preventative health care   [place], Satira Anis, MD   4 years ago Preventative health care   [place], Satira Anis, MD       Future Appointments             In 10 months Delsa Grana, PA-C [facility], [facility]                Requested Prescriptions  Pending Prescriptions Disp Refills   famotidine (PEPCID) 20 MG tablet [Pharmacy Med Name: FAMOTIDINE 20MG  TABLETS] 60 tablet 2    Sig: TAKE 1 TABLET(20 MG) BY MOUTH TWICE DAILY AS NEEDED FOR [place]     Gastroenterology:  H2 Antagonists Passed - 07/17/2021  3:32 AM      Passed - Valid encounter within last 12 months    Recent Outpatient Visits           2 months ago Annual physical exam   [facility] Delsa Grana, PA-C   1 year ago Adult general medical exam   [facility] Delsa Grana, PA-C   2 years ago Adult general medical exam   [facility] Delsa Grana, PA-C   3 years ago  Preventative health care   [facility], Satira Anis, MD   4 years ago Preventative health care   [place], Satira Anis, MD       Future Appointments             In 10 months Delsa Grana, PA-C [facility], [facility]

## 2022-05-26 NOTE — Patient Instructions (Signed)

## 2022-05-27 ENCOUNTER — Ambulatory Visit (INDEPENDENT_AMBULATORY_CARE_PROVIDER_SITE_OTHER): Payer: 59 | Admitting: Family Medicine

## 2022-05-27 ENCOUNTER — Encounter: Payer: Self-pay | Admitting: Family Medicine

## 2022-05-27 VITALS — BP 124/82 | HR 91 | Temp 97.9°F | Resp 16 | Ht 67.5 in | Wt 248.1 lb

## 2022-05-27 DIAGNOSIS — R7303 Prediabetes: Secondary | ICD-10-CM

## 2022-05-27 DIAGNOSIS — Z0000 Encounter for general adult medical examination without abnormal findings: Secondary | ICD-10-CM | POA: Diagnosis not present

## 2022-05-27 DIAGNOSIS — E669 Obesity, unspecified: Secondary | ICD-10-CM

## 2022-05-27 DIAGNOSIS — Z23 Encounter for immunization: Secondary | ICD-10-CM

## 2022-05-27 DIAGNOSIS — R7989 Other specified abnormal findings of blood chemistry: Secondary | ICD-10-CM

## 2022-05-27 DIAGNOSIS — Z1211 Encounter for screening for malignant neoplasm of colon: Secondary | ICD-10-CM

## 2022-05-27 DIAGNOSIS — Z800 Family history of malignant neoplasm of digestive organs: Secondary | ICD-10-CM | POA: Diagnosis not present

## 2022-05-27 DIAGNOSIS — Z6838 Body mass index (BMI) 38.0-38.9, adult: Secondary | ICD-10-CM

## 2022-05-27 LAB — CBC WITH DIFFERENTIAL/PLATELET
Absolute Monocytes: 708 cells/uL (ref 200–950)
Hemoglobin: 15.2 g/dL (ref 13.2–17.1)
MCV: 87.2 fL (ref 80.0–100.0)
MPV: 13.3 fL — ABNORMAL HIGH (ref 7.5–12.5)
Monocytes Relative: 9.2 %
Neutro Abs: 4474 cells/uL (ref 1500–7800)
Neutrophils Relative %: 58.1 %
Total Lymphocyte: 30.9 %
WBC: 7.7 10*3/uL (ref 3.8–10.8)

## 2022-05-27 NOTE — Progress Notes (Signed)
Patient: Jose Lowe, Male    DOB: 1980-06-07, 42 y.o.   MRN: [id number] Danelle Berry, PA-C Visit Date: 05/27/2022  Today's Provider: Danelle Berry, PA-C   Chief Complaint  Patient presents with   Annual Exam   Subjective:   Annual physical exam:  Jose Clinch is a 42 y.o. male who presents today for health maintenance and annual & complete physical exam.   Exercise/Activity:  going to the gym more Diet/nutrition:  not working on diet Sleep: sleeping well  SDOH Screenings   Food Insecurity: No Food Insecurity (05/27/2022)  Housing: Low Risk  (05/27/2022)  Transportation Needs: No Transportation Needs (05/27/2022)  Utilities: Not At Risk (05/27/2022)  Alcohol Screen: Low Risk  (05/27/2022)  Depression (PHQ2-9): Medium Risk (05/27/2022)  Financial Resource Strain: Low Risk  (05/27/2022)  Physical Activity: Insufficiently Active (05/27/2022)  Social Connections: Moderately Isolated (05/27/2022)  Stress: Stress Concern Present (05/27/2022)  Tobacco Use: Low Risk  (05/27/2022)     USPSTF grade A and B recommendations - reviewed and addressed today  Depression:  Phq 9 completed today by patient, was reviewed by me with patient in the room, score is neg , pt feels a little tired with heavy work schedule and trying to be healthy with exercising    05/27/2022    8:43 AM 05/20/2021    3:26 PM 05/19/2020    2:59 PM  Depression screen PHQ 2/9  Decreased Interest 1 0 0  Down, Depressed, Hopeless 1 0 0  PHQ - 2 Score 2 0 0  Altered sleeping 0 0 0  Tired, decreased energy 1 0 0  Change in appetite 0 0 0  Feeling bad or failure about yourself  0 0 0  Trouble concentrating 0 0 0  Moving slowly or fidgety/restless 0 0 0  Suicidal thoughts 0 0 0  PHQ-9 Score 3 0 0  Difficult doing work/chores Somewhat difficult Not difficult at all Not difficult at all    Hep C Screening: done  STD testing and prevention (HIV/chl/gon/syphilis): done previously, none needed, no sx  Intimate partner  violence:  safe at home , lives with wife  Advanced Care Planning:  A voluntary discussion about advance care planning including the explanation and discussion of advance directives.  Discussed health care proxy and Living will, and the patient was able to identify a health care proxy as wife.  Patient does not have a living will at present time. If patient does have living will, I have requested they bring this to the clinic to be scanned in to their chart.  Health Maintenance  Topic Date Due   COVID-19 Vaccine (3 - 2023-24 season) 06/12/2022 (Originally 10/15/2021)   INFLUENZA VACCINE  09/15/2022   DTaP/Tdap/Td (3 - Td or Tdap) 05/26/2032   Hepatitis C Screening  Completed   HIV Screening  Completed   HPV VACCINES  Aged Out    Skin cancer: no prior  last skin survey was.  Pt reports no hx of skin cancer, suspicious lesions/biopsies in the past.  Colorectal cancer:  colonoscopy is nearly due with sig family hx Pt denies blood in stool, change in bowels  Prostate cancer: none Prostate cancer screening with PSA: Discussed risks and benefits of PSA testing and provided handout. Pt declines to have PSA drawn today. No results found for: "PSA"  Urinary Symptoms:  no urinary sx  Lung cancer:   Low Dose CT Chest recommended if Age 64-80 years, 20 pack-year currently smoking OR have quit w/in 15years. Patient  does not qualify.   Social History   Tobacco Use   Smoking status: Never   Smokeless tobacco: Never  Substance Use Topics   Alcohol use: Yes    Alcohol/week: 4.0 standard drinks of alcohol    Types: 4 Cans of beer per week    Comment: occasionally     Alcohol screening: Flowsheet Row Office Visit from 05/27/20[facility] in [facility]  AUDIT-C Score 5       AAA:  The USPSTF recommends one-time screening with ultrasonography in men ages 32 to 49 years who have ever smoked  ZOX:WRUE indicated today   Blood pressure/Hypertension: BP Readings from Last 3  Encounters:  05/27/22 124/82  05/20/21 114/78  05/19/20 118/74   Weight/Obesity: Wt Readings from Last 3 Encounters:  05/27/22 248 lb 1.6 oz (112.5 kg)  05/20/21 246 lb 1.6 oz (111.6 kg)  05/19/20 242 lb 12.8 oz (110.1 kg)   BMI Readings from Last 3 Encounters:  05/27/22 38.28 kg/m  05/20/21 37.42 kg/m  05/19/20 36.92 kg/m    Lipids:  Lab Results  Component Value Date   CHOL 230 (H) 05/20/2021   CHOL 220 (H) 05/19/2020   CHOL 222 (H) 04/12/2019   Lab Results  Component Value Date   HDL 36 (L) 05/20/2021   HDL 35 (L) 05/19/2020   HDL 32 (L) 04/12/2019   Lab Results  Component Value Date   LDLCALC 158 (H) 05/20/2021   LDLCALC 146 (H) 05/19/2020   LDLCALC 151 (H) 04/12/2019   Lab Results  Component Value Date   TRIG 199 (H) 05/20/2021   TRIG 252 (H) 05/19/2020   TRIG 230 (H) 04/12/2019   Lab Results  Component Value Date   CHOLHDL 6.4 (H) 05/20/2021   CHOLHDL 6.3 (H) 05/19/2020   CHOLHDL 6.9 (H) 04/12/2019   No results found for: "LDLDIRECT" Based on the results of lipid panel his/her cardiovascular risk factor ( using Poole Cohort )  in the next 10 years is : The 10-year ASCVD risk score (Arnett DK, et al., 2019) is: 4.2%   Values used to calculate the score:     Age: 53 years     Sex: Male     Is Non-Hispanic African American: Yes     Diabetic: No     Tobacco smoker: No     Systolic Blood Pressure: 124 mmHg     Is BP treated: No     HDL Cholesterol: 36 mg/dL     Total Cholesterol: 230 mg/dL Glucose:  Glucose, Bld  Date Value Ref Range Status  05/20/2021 80 65 - 99 mg/dL Final    Comment:    .            Fasting reference interval .   05/19/2020 84 65 - 99 mg/dL Final    Comment:    .            Fasting reference interval .   04/12/2019 72 65 - 99 mg/dL Final    Comment:    .            Fasting reference interval .     Social History       Social History   Socioeconomic History   Marital status: Married    Spouse name: Andyn Sales   Number of children: 2   Years of education: 12   Highest education level: High school graduate  Occupational History   Occupation: Retail buyer: [employer]  Tobacco Use   Smoking status: Never   Smokeless tobacco: Never  Vaping Use   Vaping Use: Never used  Substance and Sexual Activity   Alcohol use: Yes    Alcohol/week: 4.0 standard drinks of alcohol    Types: 4 Cans of beer per week    Comment: occasionally   Drug use: No   Sexual activity: Yes    Partners: Female  Other Topics Concern   Not on file  Social History Narrative   Not on file   Social Determinants of Health   Financial Resource Strain: Low Risk  (05/27/2022)   Overall Financial Resource Strain (CARDIA)    Difficulty of Paying Living Expenses: Not hard at all  Food Insecurity: No Food Insecurity (05/27/2022)   Hunger Vital Sign    Worried About Running Out of Food in the Last Year: Never true    Ran Out of Food in the Last Year: Never true  Transportation Needs: No Transportation Needs (05/27/2022)   PRAPARE - Administrator, Civil Service (Medical): No    Lack of Transportation (Non-Medical): No  Physical Activity: Insufficiently Active (05/27/2022)   Exercise Vital Sign    Days of Exercise per Week: 2 days    Minutes of Exercise per Session: 50 min  Stress: Stress Concern Present (05/27/2022)   [employer] of Occupational Health - Occupational Stress Questionnaire    Feeling of Stress : To some extent  Social Connections: Moderately Isolated (05/27/2022)   Social Connection and Isolation Panel [NHANES]    Frequency of Communication with Friends and Family: More than three times a week    Frequency of Social Gatherings with Friends and Family: More than three times a week    Attends Religious Services: Never    Database administrator or Organizations: No    Attends Engineer, structural: Never    Marital Status: Married    Family History        Family History   Problem Relation Age of Onset   Hypertension Mother    Hyperlipidemia Mother    Hypertension Father    Cancer Father 16       colon cancer   Cancer Maternal Uncle        prostate   Cancer Maternal Grandmother        breast   Diabetes Maternal Grandmother    Stroke Maternal Grandmother    COPD Paternal Grandmother    Cancer Paternal Grandmother        Lung Cancer   COPD Paternal Grandfather    Cancer Paternal Grandfather        Lung Cancer   Schizophrenia Brother    Asthma Daughter        Mild   Heart disease Neg Hx     Patient Active Problem List   Diagnosis Date Noted   Prediabetes 05/19/2020   Class 2 obesity due to excess calories without serious comorbidity with body mass index (BMI) of 35.0 to 35.9 in adult 05/19/2020   Gastroesophageal reflux disease 05/19/2020   Family history of prostate cancer in father 04/12/2019   Elevated TSH 04/12/2019   Hyperlipidemia 04/12/2019   Low HDL (under 40) 11/12/2014    Past Surgical History:  Procedure Laterality Date   ANKLE SURGERY Right 1996   VASECTOMY  2015     Current Outpatient Medications:    famotidine (PEPCID) 20 MG tablet, TAKE 1 TABLET(20 MG) BY MOUTH TWICE DAILY AS NEEDED FOR HEARTBURN OR INDIGESTION,  Disp: 60 tablet, Rfl: 11  Allergies  Allergen Reactions   Latex Itching and Rash    Patient Care Team: Danelle Berry, PA-C as PCP - General (Family Medicine) Lada, Janit Bern, MD as Attending Physician (Family Medicine)   Chart Review: I personally reviewed active problem list, medication list, allergies, family history, social history, health maintenance, notes from last encounter, lab results, imaging with the patient/caregiver today.   Review of Systems  Constitutional: Negative.   HENT: Negative.    Eyes: Negative.   Respiratory: Negative.    Cardiovascular: Negative.   Gastrointestinal: Negative.   Endocrine: Negative.   Genitourinary: Negative.   Musculoskeletal: Negative.   Skin: Negative.    Allergic/Immunologic: Negative.   Neurological: Negative.   Hematological: Negative.   Psychiatric/Behavioral: Negative.    All other systems reviewed and are negative.         Objective:   Vitals:  Vitals:   05/27/22 0847  BP: 124/82  Pulse: 91  Resp: 16  Temp: 97.9 F (36.6 C)  TempSrc: Oral  SpO2: 94%  Weight: 248 lb 1.6 oz (112.5 kg)  Height: 5' 7.5" (1.715 m)    Body mass index is 38.28 kg/m.  Physical Exam Vitals and nursing note reviewed.  Constitutional:      General: He is not in acute distress.    Appearance: Normal appearance. He is well-developed. He is obese. He is not ill-appearing, toxic-appearing or diaphoretic.     Interventions: Face mask in place.  HENT:     Head: Normocephalic and atraumatic.     Jaw: No trismus.     Right Ear: External ear normal.     Left Ear: External ear normal.     Nose: Nose normal.     Mouth/Throat:     Mouth: Mucous membranes are moist.     Pharynx: Oropharynx is clear. No oropharyngeal exudate or posterior oropharyngeal erythema.  Eyes:     General: Lids are normal. No scleral icterus.       Right eye: No discharge.        Left eye: No discharge.     Conjunctiva/sclera: Conjunctivae normal.  Neck:     Trachea: Trachea and phonation normal. No tracheal deviation.  Cardiovascular:     Rate and Rhythm: Normal rate and regular rhythm.     Pulses: Normal pulses.          Radial pulses are 2+ on the right side and 2+ on the left side.       Posterior tibial pulses are 2+ on the right side and 2+ on the left side.     Heart sounds: Normal heart sounds. No murmur heard.    No friction rub. No gallop.  Pulmonary:     Effort: Pulmonary effort is normal. No respiratory distress.     Breath sounds: Normal breath sounds. No stridor. No wheezing, rhonchi or rales.  Abdominal:     General: Bowel sounds are normal. There is no distension.     Palpations: Abdomen is soft.     Tenderness: There is no abdominal tenderness.   Musculoskeletal:     Right lower leg: No edema.     Left lower leg: No edema.  Lymphadenopathy:     Cervical: No cervical adenopathy.  Skin:    General: Skin is warm and dry.     Coloration: Skin is not jaundiced.     Findings: No rash.     Nails: There is no clubbing.  Neurological:  Mental Status: He is alert. Mental status is at baseline.     Cranial Nerves: No dysarthria or facial asymmetry.     Motor: No tremor or abnormal muscle tone.     Gait: Gait normal.  Psychiatric:        Mood and Affect: Mood normal.        Speech: Speech normal.        Behavior: Behavior normal. Behavior is cooperative.      No results found for this or any previous visit (from the past 2160 hour(s)).  Fall Risk:    05/27/2022    8:43 AM 05/20/2021    3:26 PM 05/19/2020    2:59 PM 04/12/2019    3:00 PM 02/28/2018   10:55 AM  Fall Risk   Falls in the past year? 0 0 0 0 0  Number falls in past yr: 0 0 0 0 0  Injury with Fall? 0 0 0 0 0  Risk for fall due to : No Fall Risks No Fall Risks     Follow up Falls prevention discussed;Education provided;Falls evaluation completed Falls prevention discussed       Functional Status Survey: Is the patient deaf or have difficulty hearing?: No Does the patient have difficulty seeing, even when wearing glasses/contacts?: No Does the patient have difficulty concentrating, remembering, or making decisions?: No Does the patient have difficulty walking or climbing stairs?: No Does the patient have difficulty dressing or bathing?: No Does the patient have difficulty doing errands alone such as visiting a doctor's office or shopping?: No   Assessment & Plan:    CPE completed today  Prostate cancer screening and PSA options (with potential risks and benefits of testing vs not testing) were discussed along with recent recs/guidelines, shared decision making and handout/information given to pt today  USPSTF grade A and B recommendations reviewed with  patient; age-appropriate recommendations, preventive care, screening tests, etc discussed and encouraged; healthy living encouraged; see AVS for patient education given to patient  Discussed importance of 150 minutes of physical activity weekly, AHA exercise recommendations given to pt in AVS/handout  Discussed importance of healthy diet:  eating lean meats and proteins, avoiding trans fats and saturated fats, avoid simple sugars and excessive carbs in diet, eat 6 servings of fruit/vegetables daily and drink plenty of water and avoid sweet beverages.  DASH diet reviewed if pt has HTN  Recommended pt to do annual eye exam and routine dental exams/cleanings  Advance Care planning information and packet discussed and offered today, encouraged pt to discuss with family members/spouse/partner/friends and complete Advanced directive packet and bring copy to office   Reviewed Health Maintenance: Health Maintenance  Topic Date Due   COVID-19 Vaccine (3 - 2023-24 season) 06/12/2022 (Originally 10/15/2021)   INFLUENZA VACCINE  09/15/2022   DTaP/Tdap/Td (3 - Td or Tdap) 05/26/2032   Hepatitis C Screening  Completed   HIV Screening  Completed   HPV VACCINES  Aged Out    Immunizations: Immunization History  Administered Date(s) Administered   Influenza,inj,Quad PF,6+ Mos 12/10/2015, 02/28/2018, 11/21/2018   Influenza-Unspecified 11/08/2013, 10/16/2014, 11/21/2018   PFIZER(Purple Top)SARS-COV-2 Vaccination 04/30/2019, 05/31/2019   Td 09/09/2010   Tdap 05/27/2022   Vaccines:  HPV: up to at age 69 , ask insurance if age between 71-45  Shingrix: 34-64 yo and ask insurance if covered when patient above 29 yo Pneumonia:  educated and discussed with patient. Flu:  educated and discussed with patient. COVID:       ICD-10-CM  1. Annual physical exam  Z00.00 COMPLETE METABOLIC PANEL WITH GFR    CBC with Differential/Platelet    Lipid panel    2. Prediabetes  R73.03 COMPLETE METABOLIC PANEL WITH  GFR    Hemoglobin A1c    3. Elevated TSH  R79.89 TSH    T4, free    4. Need for Tdap vaccination  Z23 Tdap vaccine greater than or equal to 7yo IM    5. Family history of colon cancer in father  Z80.0 Ambulatory referral to Gastroenterology    6. Class 2 obesity with body mass index (BMI) of 38.0 to 38.9 in adult, unspecified obesity type, unspecified whether serious comorbidity present  E66.9 COMPLETE METABOLIC PANEL WITH GFR   Z68.38 Lipid panel    Hemoglobin A1c    TSH    T4, free    7. Screening for malignant neoplasm of colon  Z12.11 Ambulatory referral to Gastroenterology      With increasing weight, abnormal TSH and increasing A1C, pt was asked to return for routine f/up in 6 months to recheck labs/health     Danelle Berry, PA-C 05/27/22 9:03 AM  [facility]

## 2022-05-28 LAB — COMPLETE METABOLIC PANEL WITH GFR
AG Ratio: 1.4 (calc) (ref 1.0–2.5)
ALT: 30 U/L (ref 9–46)
AST: 36 U/L (ref 10–40)
Albumin: 4.2 g/dL (ref 3.6–5.1)
Alkaline phosphatase (APISO): 73 U/L (ref 36–130)
BUN: 10 mg/dL (ref 7–25)
CO2: 27 mmol/L (ref 20–32)
Calcium: 9.3 mg/dL (ref 8.6–10.3)
Chloride: 103 mmol/L (ref 98–110)
Creat: 1.07 mg/dL (ref 0.60–1.29)
Globulin: 3 g/dL (calc) (ref 1.9–3.7)
Glucose, Bld: 107 mg/dL — ABNORMAL HIGH (ref 65–99)
Potassium: 4.4 mmol/L (ref 3.5–5.3)
Sodium: 138 mmol/L (ref 135–146)
Total Bilirubin: 0.4 mg/dL (ref 0.2–1.2)
Total Protein: 7.2 g/dL (ref 6.1–8.1)
eGFR: 88 mL/min/{1.73_m2} (ref >=60)

## 2022-05-28 LAB — CBC WITH DIFFERENTIAL/PLATELET
Basophils Absolute: 39 cells/uL (ref 0–200)
Basophils Relative: 0.5 %
Eosinophils Absolute: 100 cells/uL (ref 15–500)
Eosinophils Relative: 1.3 %
HCT: 43.1 % (ref 38.5–50.0)
Lymphs Abs: 2379 cells/uL (ref 850–3900)
MCH: 30.8 pg (ref 27.0–33.0)
MCHC: 35.3 g/dL (ref 32.0–36.0)
Platelets: 238 10*3/uL (ref 140–400)
RBC: 4.94 10*6/uL (ref 4.20–5.80)
RDW: 13.1 % (ref 11.0–15.0)

## 2022-05-28 LAB — HEMOGLOBIN A1C
Hgb A1c MFr Bld: 6.2 % of total Hgb — ABNORMAL HIGH (ref <5.7)
Mean Plasma Glucose: 131 mg/dL
eAG (mmol/L): 7.3 mmol/L

## 2022-05-28 LAB — LIPID PANEL
Cholesterol: 209 mg/dL — ABNORMAL HIGH (ref <200)
HDL: 37 mg/dL — ABNORMAL LOW (ref >=40)
LDL Cholesterol (Calc): 140 mg/dL (calc) — ABNORMAL HIGH
Non-HDL Cholesterol (Calc): 172 mg/dL (calc) — ABNORMAL HIGH (ref <130)
Total CHOL/HDL Ratio: 5.6 (calc) — ABNORMAL HIGH (ref <5.0)
Triglycerides: 187 mg/dL — ABNORMAL HIGH (ref <150)

## 2022-05-28 LAB — T4, FREE: Free T4: 0.9 ng/dL (ref 0.8–1.8)

## 2022-05-28 LAB — TSH: TSH: 3.18 mIU/L (ref 0.40–4.50)

## 2022-06-06 ENCOUNTER — Encounter: Payer: Self-pay | Admitting: *Deleted

## 2022-07-28 ENCOUNTER — Other Ambulatory Visit: Payer: Self-pay | Admitting: Family Medicine

## 2022-11-25 ENCOUNTER — Ambulatory Visit: Payer: 59 | Admitting: Family Medicine

## 2023-07-12 ENCOUNTER — Other Ambulatory Visit: Payer: Self-pay | Admitting: Family Medicine

## 2023-07-12 NOTE — Telephone Encounter (Signed)
 Copied from CRM [id number]. Topic: Clinical - Medication Refill >> Jul 12, 2023  8:28 AM Ivette P wrote: Medication: famotidine  (PEPCID ) 20 MG tablet  Has the patient contacted their pharmacy? Yes (Agent: If no, request that the patient contact the pharmacy for the refill. If patient does not wish to contact the pharmacy document the reason why and proceed with request.) (Agent: If yes, when and what did the pharmacy advise?)  This is the patient's preferred pharmacy:  [id number] [place], [place] - [address] AT [facility] OF [address] [place] [address] Phone: [phone] Fax: [phone]  Is this the correct pharmacy for this prescription? Yes If no, delete pharmacy and type the correct one.   Has the prescription been filled recently? No  Is the patient out of the medication? Yes  Has the patient been seen for an appointment in the last year OR does the patient have an upcoming appointment? Yes, 07/19/2023  Can we respond through MyChart? YES  Agent: Please be advised that Rx refills may take up to 3 business days. We ask that you follow-up with your pharmacy.

## 2023-07-14 MED ORDER — FAMOTIDINE 20 MG PO TABS: 20.0000 mg | ORAL_TABLET | Freq: Two times a day (BID) | ORAL | 0 refills | Status: AC

## 2023-07-14 NOTE — Telephone Encounter (Signed)
 Requested Prescriptions  Pending Prescriptions Disp Refills   famotidine  (PEPCID ) 20 MG tablet 180 tablet 0    Sig: Take 1 tablet (20 mg total) by mouth 2 (two) times daily.     Gastroenterology:  H2 Antagonists Failed - 07/14/2023 11:44 AM      Failed - Valid encounter within last 12 months    Recent Outpatient Visits   None

## 2023-07-19 ENCOUNTER — Ambulatory Visit: Admitting: Sleep Medicine

## 2023-07-19 ENCOUNTER — Ambulatory Visit: Admitting: Family Medicine

## 2023-07-19 ENCOUNTER — Encounter: Payer: Self-pay | Admitting: Family Medicine

## 2023-07-19 ENCOUNTER — Encounter: Payer: Self-pay | Admitting: Sleep Medicine

## 2023-07-19 VITALS — BP 122/70 | HR 79 | Resp 16 | Ht 67.0 in | Wt 243.0 lb

## 2023-07-19 VITALS — BP 136/90 | HR 86 | Temp 97.6°F | Ht 67.0 in | Wt 186.6 lb

## 2023-07-19 DIAGNOSIS — R7989 Other specified abnormal findings of blood chemistry: Secondary | ICD-10-CM | POA: Diagnosis not present

## 2023-07-19 DIAGNOSIS — G4733 Obstructive sleep apnea (adult) (pediatric): Secondary | ICD-10-CM

## 2023-07-19 DIAGNOSIS — E782 Mixed hyperlipidemia: Secondary | ICD-10-CM

## 2023-07-19 DIAGNOSIS — Z0001 Encounter for general adult medical examination with abnormal findings: Secondary | ICD-10-CM | POA: Diagnosis not present

## 2023-07-19 DIAGNOSIS — G4700 Insomnia, unspecified: Secondary | ICD-10-CM

## 2023-07-19 DIAGNOSIS — Z1211 Encounter for screening for malignant neoplasm of colon: Secondary | ICD-10-CM

## 2023-07-19 DIAGNOSIS — Z0000 Encounter for general adult medical examination without abnormal findings: Secondary | ICD-10-CM

## 2023-07-19 DIAGNOSIS — K219 Gastro-esophageal reflux disease without esophagitis: Secondary | ICD-10-CM

## 2023-07-19 DIAGNOSIS — R7303 Prediabetes: Secondary | ICD-10-CM | POA: Diagnosis not present

## 2023-07-19 DIAGNOSIS — Z800 Family history of malignant neoplasm of digestive organs: Secondary | ICD-10-CM

## 2023-07-19 DIAGNOSIS — E66812 Obesity, class 2: Secondary | ICD-10-CM

## 2023-07-19 DIAGNOSIS — R0683 Snoring: Secondary | ICD-10-CM

## 2023-07-19 DIAGNOSIS — Z6838 Body mass index (BMI) 38.0-38.9, adult: Secondary | ICD-10-CM

## 2023-07-19 MED ORDER — OMEPRAZOLE 40 MG PO CPDR: 40.0000 mg | DELAYED_RELEASE_CAPSULE | Freq: Every day | ORAL | 3 refills | Status: AC

## 2023-07-19 NOTE — Patient Instructions (Signed)
 Jose Lowe

## 2023-07-19 NOTE — Progress Notes (Signed)
 Name:Sigifredo Charette MRN: [id number] DOB: [birth year]   CHIEF COMPLAINT:  EXCESSIVE DAYTIME SLEEPINESS   HISTORY OF PRESENT ILLNESS:  Mr. Haymer is a 43 y.o. w/ a h/o GERD who presents for c/o loud snoring and excessive daytime sleepiness which has been present for several years. Reports nocturnal awakenings due to reflux, however does not have difficulty falling back to sleep. Reports a 15 lb weight loss over the last few years. Denies morning headaches, RLS symptoms, dream enactment, cataplexy, hypnagogic or hypnapompic hallucinations. Reports a family history of sleep apnea. Denies drowsy driving. Drinks 5 cups of sodas daily, occasional alcohol use, denies tobacco or illicit drug use.   Bedtime 10 pm Sleep onset 20 mins Rise time 5 am   EPWORTH SLEEP SCORE 11    07/19/2023    1:20 PM  Results of the Epworth flowsheet  Sitting and reading 2  Watching TV 2  Sitting, inactive in a public place (e.g. a theatre or a meeting) 2  As a passenger in a car for an hour without a break 2  Lying down to rest in the afternoon when circumstances permit 2  Sitting and talking to someone 0  Sitting quietly after a lunch without alcohol 1  In a car, while stopped for a few minutes in traffic 0  Total score 11    PAST MEDICAL HISTORY :   has a past medical history of Allergy, COVID-19, and GERD (gastroesophageal reflux disease).  has a past surgical history that includes Vasectomy (2015) and Ankle surgery (Right, 1996). Prior to Admission medications   Medication Sig Start Date End Date Taking? Authorizing Provider  famotidine  (PEPCID ) 20 MG tablet Take 1 tablet (20 mg total) by mouth 2 (two) times daily. 07/14/23  Yes Tapia, Leisa, PA-C  omeprazole (PRILOSEC) 40 MG capsule Take 1 capsule (40 mg total) by mouth daily. 07/19/23  Yes Tapia, Leisa, PA-C   Allergies  Allergen Reactions   Latex Itching and Rash    FAMILY HISTORY:  family history includes Asthma in his daughter; COPD in his  paternal grandfather and paternal grandmother; Cancer in his maternal grandmother, maternal uncle, paternal grandfather, and paternal grandmother; Cancer (age of onset: 11) in his father; Diabetes in his maternal grandmother; Hyperlipidemia in his mother; Hypertension in his father and mother; Schizophrenia in his brother; Stroke in his maternal grandmother. SOCIAL HISTORY:  reports that he has never smoked. He has never used smokeless tobacco. He reports current alcohol use of about 10.0 standard drinks of alcohol per week. He reports that he does not use drugs.   Review of Systems:  Gen:  Denies  fever, sweats, chills weight loss  HEENT: Denies blurred vision, double vision, ear pain, eye pain, hearing loss, nose bleeds, sore throat Cardiac:  No dizziness, chest pain or heaviness, chest tightness,edema, No JVD Resp:   No cough, -sputum production, -shortness of breath,-wheezing, -hemoptysis,  Gi: Denies swallowing difficulty, stomach pain, nausea or vomiting, diarrhea, constipation, bowel incontinence Gu:  Denies bladder incontinence, burning urine Ext:   Denies Joint pain, stiffness or swelling Skin: Denies  skin rash, easy bruising or bleeding or hives Endoc:  Denies polyuria, polydipsia , polyphagia or weight change Psych:   Denies depression, insomnia or hallucinations  Other:  All other systems negative  VITAL SIGNS: BP (!) 136/90 (BP Location: Left Arm, Patient Position: Sitting, Cuff Size: Normal)   Pulse 86   Temp 97.6 F (36.4 C) (Oral)   Ht 5\' 7"  (1.702 m)  Wt 186 lb 9.6 oz (84.6 kg)   SpO2 97%   BMI 29.23 kg/m    Physical Examination:   General Appearance: No distress  EYES PERRLA, EOM intact.   NECK Supple, No JVD Pulmonary: normal breath sounds, No wheezing.  CardiovascularNormal S1,S2.  No m/r/g.   Abdomen: Benign, Soft, non-tender. Skin:   warm, no rashes, no ecchymosis  Extremities: normal, no cyanosis, clubbing. Neuro:without focal findings,  speech normal   PSYCHIATRIC: Mood, affect within normal limits.   ASSESSMENT AND PLAN  OSA I suspect that OSA is likely present due to clinical presentation. Discussed the consequences of untreated sleep apnea. Advised not to drive drowsy for safety of patient and others. Will complete further evaluation with a home sleep study and follow up to review results.    GERD Stable, on current management.    MEDICATION ADJUSTMENTS/LABS AND TESTS ORDERED: Recommend Sleep Study   Patient  satisfied with Plan of action and management. All questions answered  Follow up to review HST results and treatment plan.   I spent a total of 45 minutes reviewing chart data, face-to-face evaluation with the patient, counseling and coordination of care as detailed above.    Miking Usrey, M.D.  Sleep Medicine [facility]

## 2023-07-19 NOTE — Progress Notes (Signed)
 Patient: Jose Lowe, Male    DOB: [birth year], 43 y.o.   MRN: [id number] Adeline Hone, PA-C Visit Date: 07/19/2023  Today's Provider: Adeline Hone, PA-C   Chief Complaint  Patient presents with   Annual Exam   Subjective:   Annual physical exam:  Jose Resor is a 43 y.o. male who presents today for health maintenance and annual & complete physical exam.   Exercise/Activity:  exercising 5 d a week, physical job a lot of lifting walking long distances and up and down stairs Diet/nutrition:   needs to work on diet - says could eat less fast food and cut back on fried fatty foods, no particular diet efforts Sleep: no good sleep, trouble staying a sleep, getting to sleep and reflux also an issue, as well as snoring - would like   SDOH Screenings   Food Insecurity: No Food Insecurity (07/19/2023)  Housing: Unknown (07/19/2023)  Transportation Needs: No Transportation Needs (07/19/2023)  Utilities: Not At Risk (07/19/2023)  Alcohol Screen: Low Risk  (07/19/2023)  Depression (PHQ2-9): Low Risk  (07/19/2023)  Financial Resource Strain: Low Risk  (07/19/2023)  Physical Activity: Sufficiently Active (07/19/2023)  Social Connections: Moderately Isolated (07/19/2023)  Stress: No Stress Concern Present (07/19/2023)  Tobacco Use: Low Risk  (07/19/2023)  Health Literacy: Adequate Health Literacy (07/19/2023)    Wishes to discuss acute and chronic sx today, additional coding/cost or copay explained to pt GERD - not well controlled with current meds - pepcid  BID, waking him at night Poor sleep - trouble getting to sleep, GERD wakes him up, snoring and needs sleep med/study f/up, no records - he believes he did testing with feeling great sleep medicine in his early 73's Worried more about his health now that his kids are older and they are traveling together - wants to improve and maintain health - discussed BMI, dental disease prediabetes and preventative health - better diet, more exercise, monitoring markers for CVD  and treating early to prevent problems later - will be doing f/up on prediabetes, HLD, obesity, and abnormal TSH in addition to new meds for GERD and referral to sleep medicine  USPSTF grade A and B recommendations - reviewed and addressed today  Depression:  Phq 9 completed today by patient, was reviewed by me with patient in the room, score is  negative, pt feels mood is good    07/19/2023    9:06 AM 05/27/2022    8:43 AM 05/20/2021    3:26 PM  Depression screen PHQ 2/9  Decreased Interest 0 1 0  Down, Depressed, Hopeless 0 1 0  PHQ - 2 Score 0 2 0  Altered sleeping  0 0  Tired, decreased energy  1 0  Change in appetite  0 0  Feeling bad or failure about yourself   0 0  Trouble concentrating  0 0  Moving slowly or fidgety/restless  0 0  Suicidal thoughts  0 0  PHQ-9 Score  3 0  Difficult doing work/chores  Somewhat difficult Not difficult at all   Hep C Screening: done  STD testing and prevention (HIV/chl/gon/syphilis): no need/declined low risk  Intimate partner violence:safe   Advanced Care Planning:  A voluntary discussion about advance care planning including the explanation and discussion of advance directives.  Discussed health care proxy and Living will, and the patient was able to identify a health care proxy as Peterson Brandt wife.  Patient does not have a living will at present time. If patient does have living will, I  have requested they bring this to the clinic to be scanned in to their chart.  Health Maintenance  Topic Date Due   Colonoscopy  Never done   COVID-19 Vaccine (3 - 2024-25 season) 08/04/2023 (Originally 10/16/2022)   INFLUENZA VACCINE  09/15/2023   DTaP/Tdap/Td (3 - Td or Tdap) 05/26/2032   Hepatitis C Screening  Completed   HIV Screening  Completed   HPV VACCINES  Aged Out   Meningococcal B Vaccine  Aged Out    Skin cancer: none  last skin survey was.  Pt reports denies hx of skin cancer, suspicious lesions/biopsies in the past.  Colorectal cancer:   colonoscopy is due   Pt denies   Prostate cancer:  Prostate cancer screening with PSA: Discussed risks and benefits of PSA testing and provided handout. Pt declines to have PSA drawn today. No results found for: "PSA"  Urinary Symptoms:  IPSS not done due to age  Lung cancer:  n/a Low Dose CT Chest recommended if Age 38-80 years, 20 pack-year currently smoking OR have quit w/in 15years. Patient does not qualify.   Social History   Tobacco Use   Smoking status: Never   Smokeless tobacco: Never  Substance Use Topics   Alcohol use: Yes    Alcohol/week: 4.0 standard drinks of alcohol    Types: 4 Cans of beer per week    Comment: occasionally     Alcohol screening: Flowsheet Row Office Visit from 07/19/20[facility] in [facility]  AUDIT-C Score 2       AAA:  The USPSTF recommends one-time screening with ultrasonography in men ages 65 to 64 years who have ever smoked  ECG:  not indicated  Blood pressure/Hypertension: BP Readings from Last 3 Encounters:  07/19/23 122/70  05/27/22 124/82  05/20/21 114/78   Weight/Obesity: Wt Readings from Last 3 Encounters:  07/19/23 243 lb (110.2 kg)  05/27/22 248 lb 1.6 oz (112.5 kg)  05/20/21 246 lb 1.6 oz (111.6 kg)   BMI Readings from Last 3 Encounters:  07/19/23 38.06 kg/m  05/27/22 38.28 kg/m  05/20/21 37.42 kg/m    Lipids:  Lab Results  Component Value Date   CHOL 209 (H) 05/27/2022   CHOL 230 (H) 05/20/2021   CHOL 220 (H) 05/19/2020   Lab Results  Component Value Date   HDL 37 (L) 05/27/2022   HDL 36 (L) 05/20/2021   HDL 35 (L) 05/19/2020   Lab Results  Component Value Date   LDLCALC 140 (H) 05/27/2022   LDLCALC 158 (H) 05/20/2021   LDLCALC 146 (H) 05/19/2020   Lab Results  Component Value Date   TRIG 187 (H) 05/27/2022   TRIG 199 (H) 05/20/2021   TRIG 252 (H) 05/19/2020   Lab Results  Component Value Date   CHOLHDL 5.6 (H) 05/27/2022   CHOLHDL 6.4 (H) 05/20/2021   CHOLHDL 6.3 (H)  05/19/2020   No results found for: "LDLDIRECT" Based on the results of lipid panel his/her cardiovascular risk factor ( using Poole Cohort )  in the next 10 years is : The 10-year ASCVD risk score (Arnett DK, et al., 2019) is: 4.2%   Values used to calculate the score:     Age: 48 years     Sex: Male     Is Non-Hispanic African American: Yes     Diabetic: No     Tobacco smoker: No     Systolic Blood Pressure: 122 mmHg     Is BP treated: No  HDL Cholesterol: 37 mg/dL     Total Cholesterol: 209 mg/dL Glucose:  Glucose, Bld  Date Value Ref Range Status  05/27/2022 107 (H) 65 - 99 mg/dL Final    Comment:    .            Fasting reference interval . For someone without known diabetes, a glucose value between 100 and 125 mg/dL is consistent with prediabetes and should be confirmed with a follow-up test. .   05/20/2021 80 65 - 99 mg/dL Final    Comment:    .            Fasting reference interval .   05/19/2020 84 65 - 99 mg/dL Final    Comment:    .            Fasting reference interval .     Social History       Social History   Socioeconomic History   Marital status: Married    Spouse name: Jeziah Kretschmer   Number of children: 2   Years of education: 12   Highest education level: High school graduate  Occupational History   Occupation: Retail buyer: [employer]  Tobacco Use   Smoking status: Never   Smokeless tobacco: Never  Vaping Use   Vaping status: Never Used  Substance and Sexual Activity   Alcohol use: Yes    Alcohol/week: 4.0 standard drinks of alcohol    Types: 4 Cans of beer per week    Comment: occasionally   Drug use: No   Sexual activity: Yes    Partners: Female  Other Topics Concern   Not on file  Social History Narrative   Not on file   Social Drivers of Health   Financial Resource Strain: Low Risk  (07/19/2023)   Overall Financial Resource Strain (CARDIA)    Difficulty of Paying Living Expenses: Not hard at all  Food  Insecurity: No Food Insecurity (07/19/2023)   Hunger Vital Sign    Worried About Running Out of Food in the Last Year: Never true    Ran Out of Food in the Last Year: Never true  Transportation Needs: No Transportation Needs (07/19/2023)   PRAPARE - Administrator, Civil Service (Medical): No    Lack of Transportation (Non-Medical): No  Physical Activity: Sufficiently Active (07/19/2023)   Exercise Vital Sign    Days of Exercise per Week: 5 days    Minutes of Exercise per Session: 60 min  Stress: No Stress Concern Present (07/19/2023)   [employer] of Occupational Health - Occupational Stress Questionnaire    Feeling of Stress : Only a little  Social Connections: Moderately Isolated (07/19/2023)   Social Connection and Isolation Panel [NHANES]    Frequency of Communication with Friends and Family: More than three times a week    Frequency of Social Gatherings with Friends and Family: More than three times a week    Attends Religious Services: Never    Database administrator or Organizations: No    Attends Banker Meetings: Never    Marital Status: Married    Family History        Family History  Problem Relation Age of Onset   Hypertension Mother    Hyperlipidemia Mother    Hypertension Father    Cancer Father 84       colon cancer   Cancer Maternal Uncle        prostate  Cancer Maternal Grandmother        breast   Diabetes Maternal Grandmother    Stroke Maternal Grandmother    COPD Paternal Grandmother    Cancer Paternal Grandmother        Lung Cancer   COPD Paternal Grandfather    Cancer Paternal Grandfather        Lung Cancer   Schizophrenia Brother    Asthma Daughter        Mild   Heart disease Neg Hx     Patient Active Problem List   Diagnosis Date Noted   Family history of colon cancer in father 05/27/2022   Prediabetes 05/19/2020   Class 2 obesity due to excess calories without serious comorbidity with body mass index (BMI) of 35.0  to 35.9 in adult 05/19/2020   Gastroesophageal reflux disease 05/19/2020   Elevated TSH 04/12/2019   Hyperlipidemia 04/12/2019   Low HDL (under 40) 11/12/2014    Past Surgical History:  Procedure Laterality Date   ANKLE SURGERY Right 1996   VASECTOMY  2015     Current Outpatient Medications:    famotidine  (PEPCID ) 20 MG tablet, Take 1 tablet (20 mg total) by mouth 2 (two) times daily., Disp: 180 tablet, Rfl: 0  Allergies  Allergen Reactions   Latex Itching and Rash    Patient Care Team: Adeline Hone, PA-C as PCP - General (Family Medicine) Lada, Ernestine Heads, MD as Attending Physician (Family Medicine)   Chart Review: I personally reviewed active problem list, medication list, allergies, family history, social history, health maintenance, notes from last encounter, lab results, imaging with the patient/caregiver today.   Review of Systems  Constitutional: Negative.   HENT: Negative.    Eyes: Negative.   Respiratory: Negative.    Cardiovascular: Negative.   Gastrointestinal: Negative.   Endocrine: Negative.   Genitourinary: Negative.   Musculoskeletal: Negative.   Skin: Negative.   Allergic/Immunologic: Negative.   Neurological: Negative.   Hematological: Negative.   Psychiatric/Behavioral: Negative.    All other systems reviewed and are negative.         Objective:   Vitals:  Vitals:   07/19/23 0907  BP: 122/70  Pulse: 79  Resp: 16  SpO2: 98%  Weight: 243 lb (110.2 kg)  Height: 5\' 7"  (1.702 m)    Body mass index is 38.06 kg/m.  Physical Exam Vitals and nursing note reviewed.  Constitutional:      General: He is not in acute distress.    Appearance: Normal appearance. He is well-developed. He is obese. He is not ill-appearing, toxic-appearing or diaphoretic.  HENT:     Head: Normocephalic and atraumatic.     Jaw: No trismus.     Right Ear: Tympanic membrane and external ear normal. There is no impacted cerumen.     Left Ear: Tympanic membrane and  external ear normal. There is no impacted cerumen.     Ears:     Comments: Bilateral canals erythematous and mildly swollen    Nose: No mucosal edema, congestion or rhinorrhea.     Right Sinus: No maxillary sinus tenderness or frontal sinus tenderness.     Left Sinus: No maxillary sinus tenderness or frontal sinus tenderness.     Mouth/Throat:     Mouth: Mucous membranes are moist.     Pharynx: Oropharynx is clear. Uvula midline. No oropharyngeal exudate, posterior oropharyngeal erythema or uvula swelling.  Eyes:     General: Lids are normal. No scleral icterus.  Right eye: No discharge.        Left eye: No discharge.     Conjunctiva/sclera: Conjunctivae normal.  Neck:     Trachea: Trachea and phonation normal. No tracheal deviation.  Cardiovascular:     Rate and Rhythm: Normal rate and regular rhythm.     Pulses: Normal pulses.          Radial pulses are 2+ on the right side and 2+ on the left side.       Posterior tibial pulses are 2+ on the right side and 2+ on the left side.     Heart sounds: Normal heart sounds. No murmur heard.    No friction rub. No gallop.  Pulmonary:     Effort: Pulmonary effort is normal. No respiratory distress.     Breath sounds: Normal breath sounds. No wheezing, rhonchi or rales.  Abdominal:     General: Bowel sounds are normal.     Palpations: Abdomen is soft.  Musculoskeletal:     Cervical back: Normal range of motion and neck supple.     Right lower leg: No edema.     Left lower leg: No edema.  Skin:    General: Skin is warm and dry.     Capillary Refill: Capillary refill takes less than 2 seconds.     Findings: No rash.  Neurological:     Mental Status: He is alert. Mental status is at baseline.     Gait: Gait normal.  Psychiatric:        Mood and Affect: Mood normal.        Speech: Speech normal.        Behavior: Behavior normal.       Lab Results  Component Value Date   TSH 3.18 05/27/2022    Recheck A1c today Lab  Results  Component Value Date   HGBA1C 6.2 (H) 05/27/2022     No results found for this or any previous visit (from the past 2160 hours).  Fall Risk:    07/19/2023    9:06 AM 05/27/2022    8:43 AM 05/20/2021    3:26 PM 05/19/2020    2:59 PM 04/12/2019    3:00 PM  Fall Risk   Falls in the past year? 0 0 0 0 0  Number falls in past yr: 0 0 0 0 0  Injury with Fall? 0 0 0 0 0  Risk for fall due to : No Fall Risks No Fall Risks No Fall Risks    Follow up Falls prevention discussed Falls prevention discussed;Education provided;Falls evaluation completed Falls prevention discussed      Functional Status Survey: Is the patient deaf or have difficulty hearing?: No Does the patient have difficulty seeing, even when wearing glasses/contacts?: No Does the patient have difficulty concentrating, remembering, or making decisions?: No Does the patient have difficulty walking or climbing stairs?: No Does the patient have difficulty dressing or bathing?: No Does the patient have difficulty doing errands alone such as visiting a doctor's office or shopping?: No   Assessment & Plan:    CPE completed today  Prostate cancer screening and PSA options (with potential risks and benefits of testing vs not testing) were discussed along with recent recs/guidelines, shared decision making and handout/information given to pt today  USPSTF grade A and B recommendations reviewed with patient; age-appropriate recommendations, preventive care, screening tests, etc discussed and encouraged; healthy living encouraged; see AVS for patient education given to patient  Discussed importance of  150 minutes of physical activity weekly, AHA exercise recommendations given to pt in AVS/handout  Discussed importance of healthy diet:  eating lean meats and proteins, avoiding trans fats and saturated fats, avoid simple sugars and excessive carbs in diet, eat 6 servings of fruit/vegetables daily and drink plenty of water and avoid  sweet beverages.  DASH diet reviewed if pt has HTN  Recommended pt to do annual eye exam and routine dental exams/cleanings  Advance Care planning information and packet discussed and offered today, encouraged pt to discuss with family members/spouse/partner/friends and complete Advanced directive packet and bring copy to office   Reviewed Health Maintenance: Health Maintenance  Topic Date Due   Colonoscopy  Never done   COVID-19 Vaccine (3 - 2024-25 season) 08/04/2023 (Originally 10/16/2022)   INFLUENZA VACCINE  09/15/2023   DTaP/Tdap/Td (3 - Td or Tdap) 05/26/2032   Hepatitis C Screening  Completed   HIV Screening  Completed   HPV VACCINES  Aged Out   Meningococcal B Vaccine  Aged Out    Immunizations: Immunization History  Administered Date(s) Administered   Influenza,inj,Quad PF,6+ Mos 12/10/2015, 02/28/2018, 11/21/2018   Influenza-Unspecified 11/08/2013, 10/16/2014, 11/21/2018   PFIZER(Purple Top)SARS-COV-2 Vaccination 04/30/2019, 05/31/2019   Td 09/09/2010   Tdap 05/27/2022   Vaccines:  HPV: up to at age 24 , ask insurance if age between 86-45  Shingrix: 28-64 yo and ask insurance if covered when patient above 73 yo Pneumonia: not due educated and discussed with patient. Flu: annually -  educated and discussed with patient.     ICD-10-CM   1. Well adult exam  Z00.00 Comprehensive Metabolic Panel (CMET)    CBC with Differential/Platelet    Lipid Profile    2. Prediabetes  R73.03 HgB A1c   recheck A1c to trend, with obesity he is at risk for developing T2DM    3. Elevated TSH  R79.89 TSH + free T4   prior abnormal labs, recheck thyroid  panel    4. Class 2 obesity with body mass index (BMI) of 38.0 to 38.9 in adult, unspecified obesity type, unspecified whether serious comorbidity present  E66.812 HgB A1c   Z68.38    associated comorbiditis prediabetes, HLD, discussed preventative health measures including improving diet, exercising more    5. Colon cancer screening   Z12.11 Ambulatory referral to Gastroenterology    6. Insomnia, unspecified type  G47.00 Ambulatory referral to Pulmonology   trouble getting to sleep, and also wakes from sleep, snoring and concern for OSA    7. Snoring  R06.83 Ambulatory referral to Pulmonology   referred to pulm/Dr. REddy for sleep study/eval    8. Gastroesophageal reflux disease, unspecified whether esophagitis present  K21.9 omeprazole (PRILOSEC) 40 MG capsule   poorly controlled on pepcid  BID, PPI trial, also diet/lifestyle efforts would help, decrease ETOH, fried foods, spicy foods, NSAIDs    9. Family history of colon cancer in father  Z74.0 Ambulatory referral to Gastroenterology   age 89, pt is 57, asx, refered to GI for CRC screening    10. Mixed hyperlipidemia  E78.2    not on statins currently, discussed improving diet and exercising more, will recheck lipids      Return in about 6 months (around 01/18/2024) for routine f/up prediabetes.     Adeline Hone, PA-C 07/19/23 9:31 AM  [facility]

## 2023-07-19 NOTE — Patient Instructions (Addendum)
 See this link for instructions and documents that will help you complete advance care planning/advanced directives - including designating a medical power of attorney, completing a living will, etc [email or web address]   Health Maintenance  Topic Date Due   Colon Cancer Screening  Never done   COVID-19 Vaccine (3 - 2024-25 season) 08/04/2023*   Flu Shot  09/15/2023   DTaP/Tdap/Td vaccine (3 - Td or Tdap) 05/26/2032   Hepatitis C Screening  Completed   HIV Screening  Completed   HPV Vaccine  Aged Out   Meningitis B Vaccine  Aged Out  *Topic was postponed. The date shown is not the original due date.   You should get a call from [facility] to set up a colonoscopy  - let us  know if you do not hear from them in a few weeks.

## 2023-07-20 ENCOUNTER — Ambulatory Visit: Payer: Self-pay | Admitting: Family Medicine

## 2023-07-20 LAB — COMPREHENSIVE METABOLIC PANEL WITH GFR
AG Ratio: 1.4 (calc) (ref 1.0–2.5)
ALT: 25 U/L (ref 9–46)
AST: 22 U/L (ref 10–40)
Albumin: 4.3 g/dL (ref 3.6–5.1)
Alkaline phosphatase (APISO): 71 U/L (ref 36–130)
BUN: 13 mg/dL (ref 7–25)
CO2: 28 mmol/L (ref 20–32)
Calcium: 9.5 mg/dL (ref 8.6–10.3)
Chloride: 103 mmol/L (ref 98–110)
Creat: 1.06 mg/dL (ref 0.60–1.29)
Globulin: 3.1 g/dL (ref 1.9–3.7)
Glucose, Bld: 101 mg/dL — ABNORMAL HIGH (ref 65–99)
Potassium: 4.4 mmol/L (ref 3.5–5.3)
Sodium: 138 mmol/L (ref 135–146)
Total Bilirubin: 0.5 mg/dL (ref 0.2–1.2)
Total Protein: 7.4 g/dL (ref 6.1–8.1)
eGFR: 88 mL/min/{1.73_m2} (ref >=60)

## 2023-07-20 LAB — LIPID PANEL
Cholesterol: 222 mg/dL — ABNORMAL HIGH (ref <200)
HDL: 41 mg/dL (ref >=40)
LDL Cholesterol (Calc): 150 mg/dL — ABNORMAL HIGH
Non-HDL Cholesterol (Calc): 181 mg/dL — ABNORMAL HIGH (ref <130)
Total CHOL/HDL Ratio: 5.4 (calc) — ABNORMAL HIGH (ref <5.0)
Triglycerides: 170 mg/dL — ABNORMAL HIGH (ref <150)

## 2023-07-20 LAB — HEMOGLOBIN A1C
Hgb A1c MFr Bld: 6.2 % — ABNORMAL HIGH (ref <5.7)
Mean Plasma Glucose: 131 mg/dL
eAG (mmol/L): 7.3 mmol/L

## 2023-07-20 LAB — CBC WITH DIFFERENTIAL/PLATELET
Absolute Lymphocytes: 2781 {cells}/uL (ref 850–3900)
Absolute Monocytes: 587 {cells}/uL (ref 200–950)
Basophils Absolute: 41 {cells}/uL (ref 0–200)
Basophils Relative: 0.6 %
Eosinophils Absolute: 193 {cells}/uL (ref 15–500)
Eosinophils Relative: 2.8 %
HCT: 46 % (ref 38.5–50.0)
Hemoglobin: 15.4 g/dL (ref 13.2–17.1)
MCH: 30.1 pg (ref 27.0–33.0)
MCHC: 33.5 g/dL (ref 32.0–36.0)
MCV: 90 fL (ref 80.0–100.0)
MPV: 13.3 fL — ABNORMAL HIGH (ref 7.5–12.5)
Monocytes Relative: 8.5 %
Neutro Abs: 3298 {cells}/uL (ref 1500–7800)
Neutrophils Relative %: 47.8 %
Platelets: 214 10*3/uL (ref 140–400)
RBC: 5.11 10*6/uL (ref 4.20–5.80)
RDW: 13.2 % (ref 11.0–15.0)
Total Lymphocyte: 40.3 %
WBC: 6.9 10*3/uL (ref 3.8–10.8)

## 2023-07-20 LAB — TSH+FREE T4: TSH W/REFLEX TO FT4: 4.31 m[IU]/L (ref 0.40–4.50)

## 2023-08-07 NOTE — Telephone Encounter (Signed)
 Copied from CRM [id number]. Topic: Appointments - Appointment Info/Confirmation >> Aug 07, 2023 11:26 AM Montie POUR wrote: Patient/patient representative is calling for information regarding an appointment.  I gave him the number and address to [facility] at [facility]. He will call and make an appointment. >> Aug 07, 2023 11:34 AM Montie POUR wrote: [facility] did not received the referral for [place]. Please resend and send [place] a note through [employer] when completed.

## 2023-08-28 ENCOUNTER — Encounter

## 2023-08-28 DIAGNOSIS — G4733 Obstructive sleep apnea (adult) (pediatric): Secondary | ICD-10-CM

## 2023-08-30 ENCOUNTER — Ambulatory Visit: Payer: Self-pay

## 2023-08-30 DIAGNOSIS — G4733 Obstructive sleep apnea (adult) (pediatric): Secondary | ICD-10-CM

## 2023-08-30 NOTE — Telephone Encounter (Signed)
-----   Message from [facility] REDDY sent at 08/30/2023 12:46 PM EDT ----- Please notify patient that HST revealed severe OSA, recommend proceeding with APAP therapy set to 4-16 cm H2O, EPR 3 with the Airtouch N30i nasal mask. Please also schedule a 3 month follow up visit  if patient wishes to proceed with CPAP therapy. Thanks    ----- Message ----- From: Signa Carren LABOR, NT Sent: 08/29/2023   8:30 AM EDT To: Pallavi D Reddy, MD

## 2023-09-04 ENCOUNTER — Ambulatory Visit: Admitting: Sleep Medicine

## 2023-09-28 ENCOUNTER — Telehealth: Payer: Self-pay

## 2023-09-28 NOTE — Telephone Encounter (Signed)
 Spoke with pt who had received his CPAP machine yesterday and was looking to get set up so he could use it tonight. Directed pt to contact [facility] since they typically handle CPAP set-ups and should send someone over to his home to ensure machine is operating and mask is fitting properly. Pt expressed understanding and said he will give them a call.

## 2023-09-28 NOTE — Telephone Encounter (Signed)
 Copied from CRM [id number]. Topic: Clinical - Medical Advice >> Sep 28, 2023 12:06 PM [name] wrote: Reason for CRM:   Pt received his CPAP machine on 8/13 and is needing assistance setting machine up. Advised he could contact DME company for assistance; however, he requested Reddy's nurse contact him for assistance.  Requests call at 4:30 PM if possible, he works until 3 PM and needs time to get home and have machine ready.   Of note, scheduled pt for CPAP compliance visit in 10/2023 during call.   CB#  [phone]

## 2023-10-30 ENCOUNTER — Ambulatory Visit: Admitting: Sleep Medicine

## 2023-10-30 ENCOUNTER — Encounter: Payer: Self-pay | Admitting: Sleep Medicine

## 2023-10-30 VITALS — BP 100/70 | HR 79 | Temp 98.2°F | Ht 67.0 in | Wt 245.0 lb

## 2023-10-30 DIAGNOSIS — G4733 Obstructive sleep apnea (adult) (pediatric): Secondary | ICD-10-CM | POA: Diagnosis not present

## 2023-10-30 DIAGNOSIS — E669 Obesity, unspecified: Secondary | ICD-10-CM | POA: Diagnosis not present

## 2023-10-30 NOTE — Patient Instructions (Signed)
 Patient Instructions  Continue to use CPAP every night, minimum of 4-6 hours a night.  Change equipment every 30 days or as directed by DME.  Wash your tubing with warm soap and water daily, hang to dry. Wash humidifier portion weekly. Use bottled, distilled water and change daily   Be aware of reduced alertness and do not drive or operate heavy machinery if experiencing this or drowsiness.  Exercise encouraged, as tolerated. Encouraged proper weight management.  Important to get eight or more hours of sleep  Limiting the use of the computer and television before bedtime.  Decrease naps during the day, so night time sleep will become enhanced.  Limit caffeine, and sleep deprivation.  HTN, stroke, uncontrolled diabetes and heart failure are potential risk factors.  Risk of untreated sleep apnea including cardiac arrhthymias, stroke, DM, pulm HTN.

## 2023-10-30 NOTE — Progress Notes (Signed)
 Name:Jose Lowe MRN: [id number] DOB: 1980/11/24   CHIEF COMPLAINT:  CPAP F/U   HISTORY OF PRESENT ILLNESS: Jose Lowe is a 43 y.o. w/ a h/o OSA, obesity and GERD who presents for CPAP F/U visit. Reports using CPAP therapy every night, which is confirmed by compliance data. He is currently using the Airfit N30i nasal mask, which is comfortable. Denies air leaks or nasal congestion. Reports feeling significantly more refreshed upon awakening with CPAP therapy.    EPWORTH SLEEP SCORE    07/19/2023    1:20 PM  Results of the Epworth flowsheet  Sitting and reading 2  Watching TV 2  Sitting, inactive in a public place (e.g. a theatre or a meeting) 2  As a passenger in a car for an hour without a break 2  Lying down to rest in the afternoon when circumstances permit 2  Sitting and talking to someone 0  Sitting quietly after a lunch without alcohol 1  In a car, while stopped for a few minutes in traffic 0  Total score 11    PAST MEDICAL HISTORY :   has a past medical history of Allergy, COVID-19, GERD (gastroesophageal reflux disease), and OSA (obstructive sleep apnea).  has a past surgical history that includes Vasectomy (2015) and Ankle surgery (Right, 1996). Prior to Admission medications   Medication Sig Start Date End Date Taking? Authorizing Provider  famotidine  (PEPCID ) 20 MG tablet Take 1 tablet (20 mg total) by mouth 2 (two) times daily. 07/14/23  Yes Tapia, Leisa, PA-C  omeprazole  (PRILOSEC) 40 MG capsule Take 1 capsule (40 mg total) by mouth daily. 07/19/23  Yes Tapia, Leisa, PA-C   Allergies  Allergen Reactions   Latex Itching and Rash    FAMILY HISTORY:  family history includes Asthma in his daughter; COPD in his paternal grandfather and paternal grandmother; Cancer in his maternal grandmother, maternal uncle, paternal grandfather, and paternal grandmother; Cancer (age of onset: 71) in his father; Diabetes in his maternal grandmother; Hyperlipidemia in his mother;  Hypertension in his father and mother; Schizophrenia in his brother; Stroke in his maternal grandmother. SOCIAL HISTORY:  reports that he has never smoked. He has never used smokeless tobacco. He reports current alcohol use of about 10.0 standard drinks of alcohol per week. He reports that he does not use drugs.   Review of Systems:  Gen:  Denies  fever, sweats, chills weight loss  HEENT: Denies blurred vision, double vision, ear pain, eye pain, hearing loss, nose bleeds, sore throat Cardiac:  No dizziness, chest pain or heaviness, chest tightness,edema, No JVD Resp:   No cough, -sputum production, -shortness of breath,-wheezing, -hemoptysis,  Gi: Denies swallowing difficulty, stomach pain, nausea or vomiting, diarrhea, constipation, bowel incontinence Gu:  Denies bladder incontinence, burning urine Ext:   Denies Joint pain, stiffness or swelling Skin: Denies  skin rash, easy bruising or bleeding or hives Endoc:  Denies polyuria, polydipsia , polyphagia or weight change Psych:   Denies depression, insomnia or hallucinations  Other:  All other systems negative  VITAL SIGNS: BP 100/70   Pulse 79   Temp 98.2 F (36.8 C)   Ht 5' 7 (1.702 m)   Wt 245 lb (111.1 kg)   SpO2 96%   BMI 38.37 kg/m    Physical Examination:   General Appearance: No distress  EYES PERRLA, EOM intact.   NECK Supple, No JVD Pulmonary: normal breath sounds, No wheezing.  CardiovascularNormal S1,S2.  No m/r/g.   Abdomen: Benign,  Soft, non-tender. Skin:   warm, no rashes, no ecchymosis  Extremities: normal, no cyanosis, clubbing. Neuro:without focal findings,  speech normal  PSYCHIATRIC: Mood, affect within normal limits.   ASSESSMENT AND PLAN  OSA Patient is using and benefiting from CPAP therapy. Counseled patient on increasing CPAP compliance. Discussed the consequences of untreated sleep apnea. Advised not to drive drowsy for safety of patient and others. Will follow up in 3 months.     Obesity Counseled patient on diet and lifestyle modification.    Patient  satisfied with Plan of action and management. All questions answered  I spent a total of 20 minutes reviewing chart data, face-to-face evaluation with the patient, counseling and coordination of care as detailed above.    Deniesha Stenglein, M.D.  Sleep Medicine [facility]

## 2023-11-22 ENCOUNTER — Ambulatory Visit: Payer: Self-pay

## 2023-11-22 DIAGNOSIS — Z23 Encounter for immunization: Secondary | ICD-10-CM

## 2023-11-30 ENCOUNTER — Ambulatory Visit: Admitting: Sleep Medicine

## 2024-01-19 ENCOUNTER — Ambulatory Visit: Admitting: Family Medicine

## 2024-01-29 ENCOUNTER — Encounter: Payer: Self-pay | Admitting: Sleep Medicine

## 2024-01-29 ENCOUNTER — Ambulatory Visit: Admitting: Sleep Medicine

## 2024-01-29 VITALS — BP 110/80 | HR 67 | Temp 97.9°F | Ht 67.0 in | Wt 250.2 lb

## 2024-01-29 DIAGNOSIS — E669 Obesity, unspecified: Secondary | ICD-10-CM

## 2024-01-29 DIAGNOSIS — G4733 Obstructive sleep apnea (adult) (pediatric): Secondary | ICD-10-CM

## 2024-01-29 NOTE — Patient Instructions (Signed)

## 2024-01-29 NOTE — Progress Notes (Signed)
 Name:Jose Lowe MRN: [id number] DOB: 1980-04-14   CHIEF COMPLAINT:  CPAP F/U   HISTORY OF PRESENT ILLNESS: Jose Lowe is a 43 y.o. w/ a h/o OSA, obesity and GERD who presents for CPAP F/U visit. Reports using CPAP therapy every night, which is confirmed by compliance data. He is currently using the Airfit N30i nasal mask, which is comfortable. Denies air leaks or nasal congestion. Reports feeling significantly more refreshed upon awakening with CPAP therapy.    EPWORTH SLEEP SCORE    07/19/2023    1:20 PM  Results of the Epworth flowsheet  Sitting and reading 2  Watching TV 2  Sitting, inactive in a public place (e.g. a theatre or a meeting) 2  As a passenger in a car for an hour without a break 2  Lying down to rest in the afternoon when circumstances permit 2  Sitting and talking to someone 0  Sitting quietly after a lunch without alcohol 1  In a car, while stopped for a few minutes in traffic 0  Total score 11    PAST MEDICAL HISTORY :   has a past medical history of Allergy, COVID-19, GERD (gastroesophageal reflux disease), and OSA (obstructive sleep apnea).  has a past surgical history that includes Vasectomy (2015) and Ankle surgery (Right, 1996). Prior to Admission medications   Medication Sig Start Date End Date Taking? Authorizing Provider  famotidine  (PEPCID ) 20 MG tablet Take 1 tablet (20 mg total) by mouth 2 (two) times daily. 07/14/23  Yes Tapia, Leisa, PA-C  omeprazole  (PRILOSEC) 40 MG capsule Take 1 capsule (40 mg total) by mouth daily. 07/19/23  Yes Tapia, Leisa, PA-C   Allergies  Allergen Reactions   Latex Itching and Rash    FAMILY HISTORY:  family history includes Asthma in his daughter; COPD in his paternal grandfather and paternal grandmother; Cancer in his maternal grandmother, maternal uncle, paternal grandfather, and paternal grandmother; Cancer (age of onset: 21) in his father; Diabetes in his maternal grandmother; Hyperlipidemia in his mother;  Hypertension in his father and mother; Schizophrenia in his brother; Stroke in his maternal grandmother. SOCIAL HISTORY:  reports that he has never smoked. He has never used smokeless tobacco. He reports current alcohol use of about 10.0 standard drinks of alcohol per week. He reports that he does not use drugs.   Review of Systems:  Gen:  Denies  fever, sweats, chills weight loss  HEENT: Denies blurred vision, double vision, ear pain, eye pain, hearing loss, nose bleeds, sore throat Cardiac:  No dizziness, chest pain or heaviness, chest tightness,edema, No JVD Resp:   No cough, -sputum production, -shortness of breath,-wheezing, -hemoptysis,  Gi: Denies swallowing difficulty, stomach pain, nausea or vomiting, diarrhea, constipation, bowel incontinence Gu:  Denies bladder incontinence, burning urine Ext:   Denies Joint pain, stiffness or swelling Skin: Denies  skin rash, easy bruising or bleeding or hives Endoc:  Denies polyuria, polydipsia , polyphagia or weight change Psych:   Denies depression, insomnia or hallucinations  Other:  All other systems negative  VITAL SIGNS: BP 110/80   Pulse 67   Temp 97.9 F (36.6 C)   Ht 5' 7 (1.702 m)   Wt 250 lb 3.2 oz (113.5 kg) Comment: with heavy boots  SpO2 99%   BMI 39.19 kg/m    Physical Examination:   General Appearance: No distress  EYES PERRLA, EOM intact.   NECK Supple, No JVD Pulmonary: normal breath sounds, No wheezing.  CardiovascularNormal S1,S2.  No  m/r/g.   Abdomen: Benign, Soft, non-tender. Skin:   warm, no rashes, no ecchymosis  Extremities: normal, no cyanosis, clubbing. Neuro:without focal findings,  speech normal  PSYCHIATRIC: Mood, affect within normal limits.   ASSESSMENT AND PLAN  OSA Patient is using and benefiting from CPAP therapy. Counseled patient on increasing total sleep time. Discussed the consequences of untreated sleep apnea. Advised not to drive drowsy for safety of patient and others. Will follow  up in 3 months.    Obesity Counseled patient on diet and lifestyle modification.    Patient  satisfied with Plan of action and management. All questions answered  I spent a total of 23 minutes reviewing chart data, face-to-face evaluation with the patient, counseling and coordination of care as detailed above.    Laporsche Hoeger, M.D.  Sleep Medicine [facility]

## 2024-02-23 ENCOUNTER — Ambulatory Visit: Payer: Self-pay

## 2024-02-23 DIAGNOSIS — Z1211 Encounter for screening for malignant neoplasm of colon: Secondary | ICD-10-CM | POA: Diagnosis present

## 2024-02-23 DIAGNOSIS — Z800 Family history of malignant neoplasm of digestive organs: Secondary | ICD-10-CM | POA: Diagnosis not present

## 2024-02-23 DIAGNOSIS — K621 Rectal polyp: Secondary | ICD-10-CM | POA: Diagnosis not present

## 2024-02-23 DIAGNOSIS — K5730 Diverticulosis of large intestine without perforation or abscess without bleeding: Secondary | ICD-10-CM | POA: Diagnosis not present

## 2024-07-22 ENCOUNTER — Encounter: Admitting: Family Medicine
